# Patient Record
Sex: Female | Born: 1955 | Race: White | Hispanic: No | Marital: Single | Smoking: Former smoker
Health system: Southern US, Community
[De-identification: ages and names within clinical notes are randomized; demographics above are authoritative.]

## PROBLEM LIST (undated history)

## (undated) DIAGNOSIS — I10 Essential (primary) hypertension: Secondary | ICD-10-CM

## (undated) DIAGNOSIS — R0600 Dyspnea, unspecified: Secondary | ICD-10-CM

## (undated) DIAGNOSIS — J189 Pneumonia, unspecified organism: Secondary | ICD-10-CM

## (undated) DIAGNOSIS — J439 Emphysema, unspecified: Secondary | ICD-10-CM

## (undated) DIAGNOSIS — I739 Peripheral vascular disease, unspecified: Secondary | ICD-10-CM

## (undated) DIAGNOSIS — F419 Anxiety disorder, unspecified: Secondary | ICD-10-CM

## (undated) DIAGNOSIS — F32A Depression, unspecified: Secondary | ICD-10-CM

## (undated) HISTORY — PX: TONSILLECTOMY: SUR1361

## (undated) HISTORY — PX: ABDOMINAL HYSTERECTOMY: SHX81

## (undated) HISTORY — DX: Emphysema, unspecified: J43.9

## (undated) HISTORY — DX: Depression, unspecified: F32.A

---

## 2004-01-09 ENCOUNTER — Emergency Department (HOSPITAL_COMMUNITY): Admission: EM | Admit: 2004-01-09 | Discharge: 2004-01-09 | Payer: Self-pay | Admitting: Emergency Medicine

## 2005-11-04 ENCOUNTER — Emergency Department (HOSPITAL_COMMUNITY): Admission: EM | Admit: 2005-11-04 | Discharge: 2005-11-04 | Payer: Self-pay | Admitting: Emergency Medicine

## 2006-04-15 ENCOUNTER — Emergency Department (HOSPITAL_COMMUNITY): Admission: EM | Admit: 2006-04-15 | Discharge: 2006-04-15 | Payer: Self-pay | Admitting: Emergency Medicine

## 2006-05-20 ENCOUNTER — Emergency Department (HOSPITAL_COMMUNITY): Admission: EM | Admit: 2006-05-20 | Discharge: 2006-05-20 | Payer: Self-pay | Admitting: Emergency Medicine

## 2006-09-08 ENCOUNTER — Emergency Department (HOSPITAL_COMMUNITY): Admission: EM | Admit: 2006-09-08 | Discharge: 2006-09-08 | Payer: Self-pay | Admitting: Emergency Medicine

## 2007-05-01 ENCOUNTER — Emergency Department (HOSPITAL_COMMUNITY): Admission: EM | Admit: 2007-05-01 | Discharge: 2007-05-01 | Payer: Self-pay | Admitting: Emergency Medicine

## 2007-06-13 ENCOUNTER — Emergency Department (HOSPITAL_COMMUNITY): Admission: EM | Admit: 2007-06-13 | Discharge: 2007-06-13 | Payer: Self-pay | Admitting: Emergency Medicine

## 2007-07-18 ENCOUNTER — Emergency Department (HOSPITAL_COMMUNITY): Admission: EM | Admit: 2007-07-18 | Discharge: 2007-07-18 | Payer: Self-pay | Admitting: Emergency Medicine

## 2007-07-30 ENCOUNTER — Emergency Department (HOSPITAL_COMMUNITY): Admission: EM | Admit: 2007-07-30 | Discharge: 2007-07-30 | Payer: Self-pay | Admitting: Emergency Medicine

## 2007-08-05 ENCOUNTER — Emergency Department (HOSPITAL_COMMUNITY): Admission: EM | Admit: 2007-08-05 | Discharge: 2007-08-05 | Payer: Self-pay | Admitting: Emergency Medicine

## 2007-09-27 ENCOUNTER — Emergency Department (HOSPITAL_COMMUNITY): Admission: EM | Admit: 2007-09-27 | Discharge: 2007-09-27 | Payer: Self-pay | Admitting: Psychology

## 2008-01-15 ENCOUNTER — Emergency Department (HOSPITAL_COMMUNITY): Admission: EM | Admit: 2008-01-15 | Discharge: 2008-01-15 | Payer: Self-pay | Admitting: Emergency Medicine

## 2008-11-16 ENCOUNTER — Emergency Department (HOSPITAL_COMMUNITY): Admission: EM | Admit: 2008-11-16 | Discharge: 2008-11-16 | Payer: Self-pay | Admitting: Emergency Medicine

## 2009-02-06 ENCOUNTER — Emergency Department (HOSPITAL_COMMUNITY): Admission: EM | Admit: 2009-02-06 | Discharge: 2009-02-06 | Payer: Self-pay | Admitting: Emergency Medicine

## 2009-05-15 ENCOUNTER — Emergency Department (HOSPITAL_COMMUNITY): Admission: EM | Admit: 2009-05-15 | Discharge: 2009-05-15 | Payer: Self-pay | Admitting: Emergency Medicine

## 2009-11-01 ENCOUNTER — Emergency Department (HOSPITAL_COMMUNITY): Admission: EM | Admit: 2009-11-01 | Discharge: 2009-11-01 | Payer: Self-pay | Admitting: Emergency Medicine

## 2010-02-05 ENCOUNTER — Emergency Department (HOSPITAL_COMMUNITY): Admission: EM | Admit: 2010-02-05 | Discharge: 2010-02-05 | Payer: Self-pay | Admitting: Emergency Medicine

## 2010-11-23 LAB — GLUCOSE, CAPILLARY: Glucose-Capillary: 96 mg/dL (ref 70–99)

## 2010-12-14 LAB — URINALYSIS, ROUTINE W REFLEX MICROSCOPIC
Glucose, UA: NEGATIVE mg/dL
Ketones, ur: NEGATIVE mg/dL
Nitrite: NEGATIVE
pH: 5.5 (ref 5.0–8.0)

## 2010-12-14 LAB — DIFFERENTIAL
Basophils Absolute: 0.1 10*3/uL (ref 0.0–0.1)
Eosinophils Absolute: 0.2 10*3/uL (ref 0.0–0.7)
Eosinophils Relative: 3 % (ref 0–5)
Lymphocytes Relative: 32 % (ref 12–46)
Lymphs Abs: 1.5 10*3/uL (ref 0.7–4.0)
Monocytes Absolute: 0.4 10*3/uL (ref 0.1–1.0)
Monocytes Relative: 8 % (ref 3–12)

## 2010-12-14 LAB — COMPREHENSIVE METABOLIC PANEL
ALT: <8 U/L (ref 0–35)
AST: 19 U/L (ref 0–37)
Albumin: 3.8 g/dL (ref 3.5–5.2)
Alkaline Phosphatase: 95 U/L (ref 39–117)
Calcium: 9.7 mg/dL (ref 8.4–10.5)
Chloride: 96 mEq/L (ref 96–112)
Creatinine, Ser: 1.27 mg/dL — ABNORMAL HIGH (ref 0.4–1.2)
Total Bilirubin: 0.5 mg/dL (ref 0.3–1.2)
Total Protein: 7.2 g/dL (ref 6.0–8.3)

## 2010-12-14 LAB — LIPASE, BLOOD: Lipase: 30 U/L (ref 11–59)

## 2010-12-14 LAB — URINE MICROSCOPIC-ADD ON

## 2010-12-14 LAB — CBC
Platelets: 202 10*3/uL (ref 150–400)
RBC: 4.23 MIL/uL (ref 3.87–5.11)

## 2011-09-22 DIAGNOSIS — F332 Major depressive disorder, recurrent severe without psychotic features: Secondary | ICD-10-CM | POA: Diagnosis not present

## 2011-10-01 IMAGING — CT CT HEAD W/O CM
4 of 5 series · 13 of 47 positions shown, 14 images · non-contrast
Comparison: None.

CT HEAD

CLINICAL DATA: Fall with a blow the head.

CT HEAD WITHOUT CONTRAST
CT CERVICAL SPINE WITHOUT CONTRAST
TECHNIQUE: Multidetector CT imaging of the head and cervical spine
was performed following the standard protocol without intravenous
contrast.  Multiplanar CT image reconstructions of the cervical
spine were also generated.

[Series 2: headseq 4.8 h37s · axial · 0.42mm/px · z∈[+1027,+1077]mm · 2 of 30 slices shown, 3 images]
[im 10/30  brain]
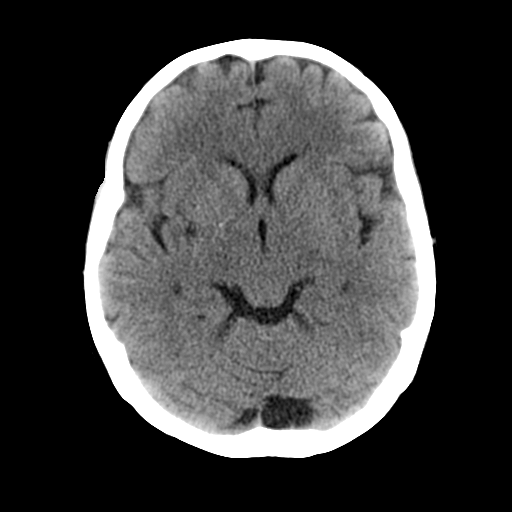
[im 10/30  bone]
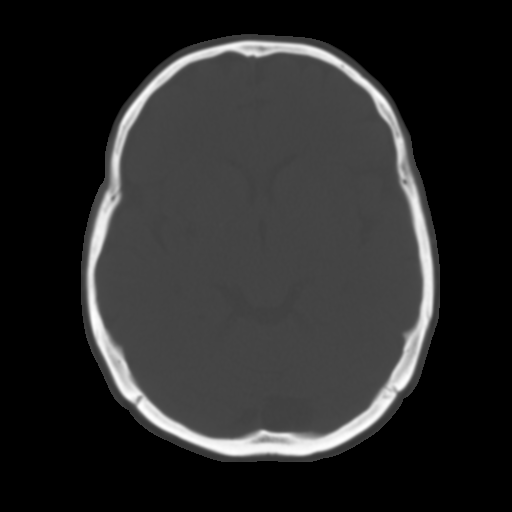
[im 20/30  brain]
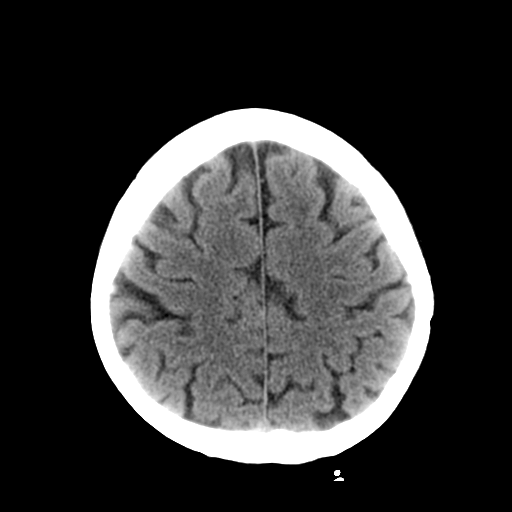

[Series 4: cervical st 2.0 b31s · axial · 0.25mm/px · z∈[+810,+896]mm · 5 of 95 slices shown]
[im 8/95  brain]
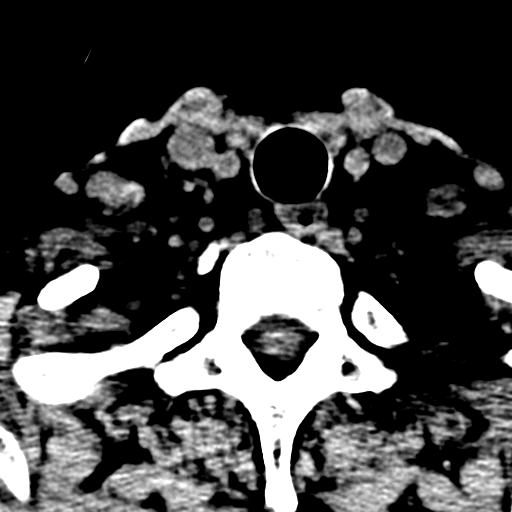
[im 22/95  brain]
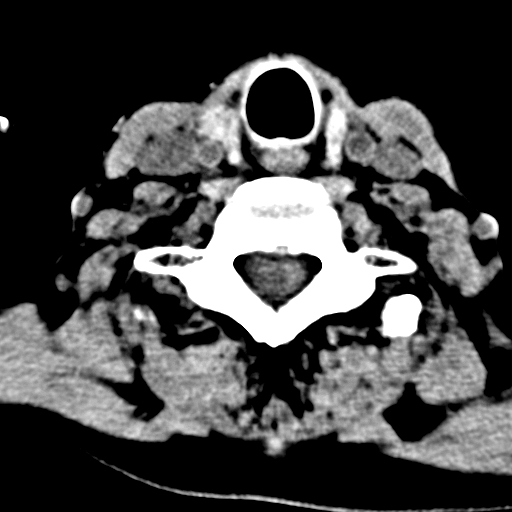
[im 29/95  brain]
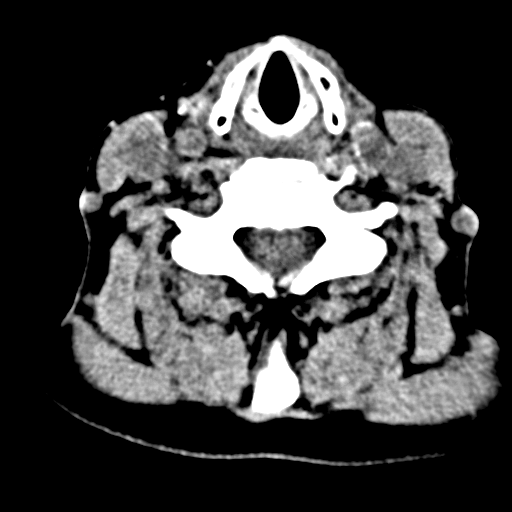
[im 44/95  brain]
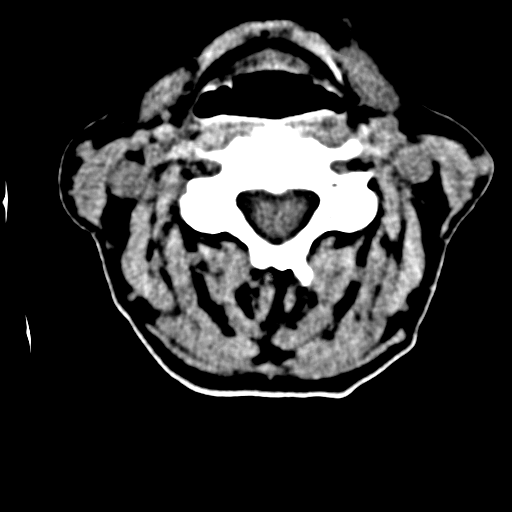
[im 51/95  brain]
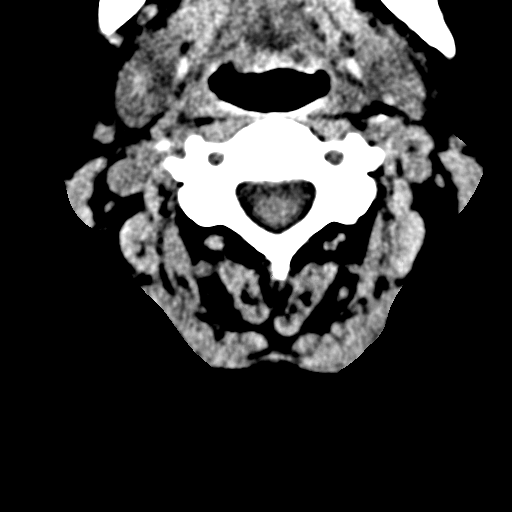

[Series 7: cervical coro (id) · coronal · 0.14mm/px · 3 of 41 slices shown]
[im 14/41  brain]
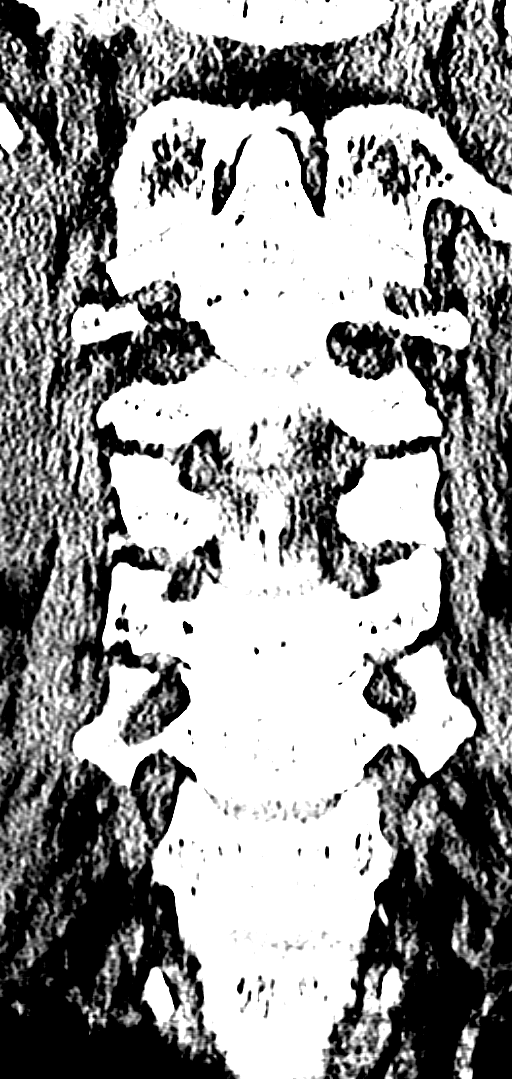
[im 18/41  brain]
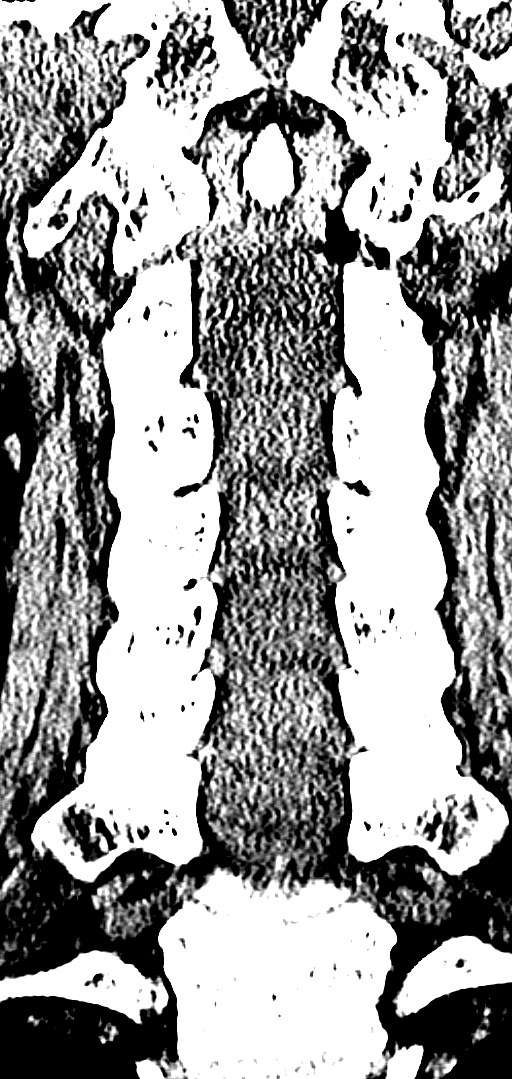
[im 23/41  brain]
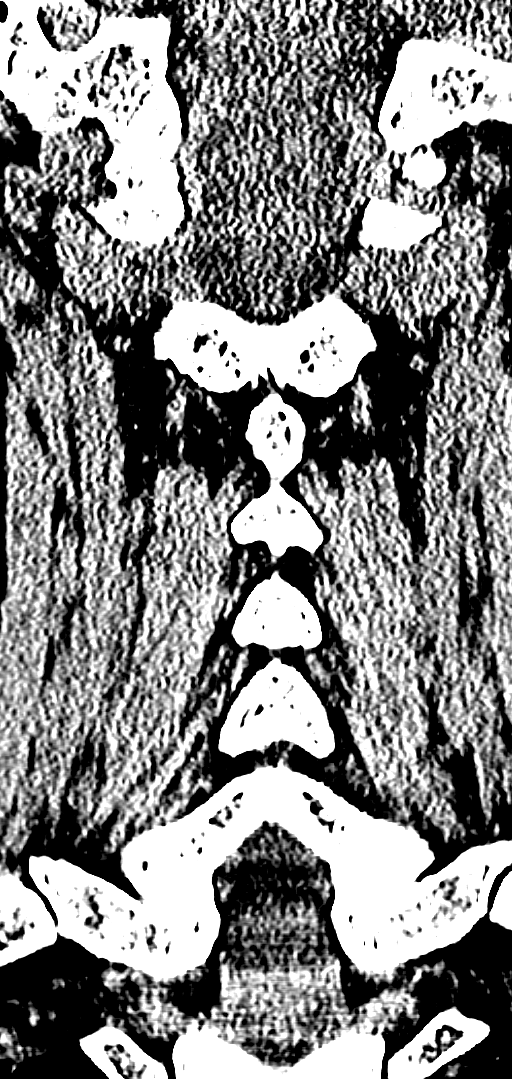

[Series 8: cervical sag (id) · sagittal · 0.18mm/px · 3 of 40 slices shown]
[im 14/40  brain]
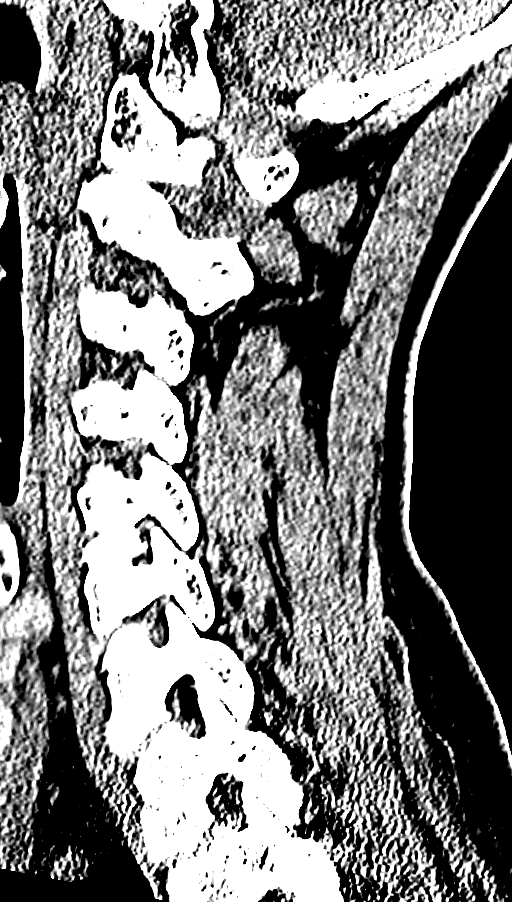
[im 20/40  brain]
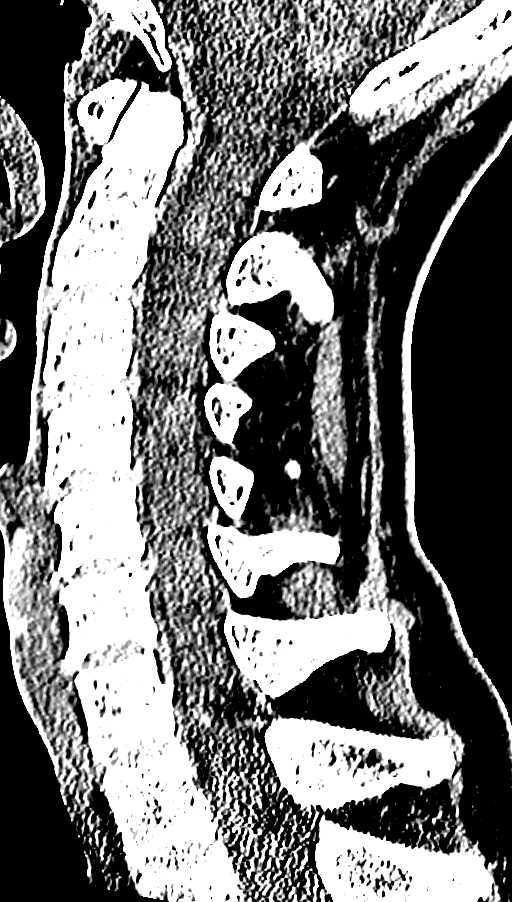
[im 27/40  brain]
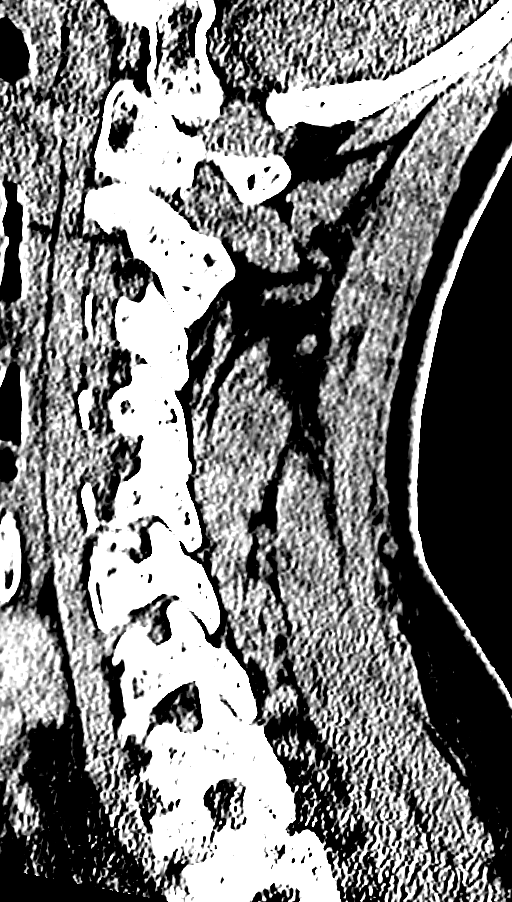

[13 of 47 positions shown; findings below may reference images not displayed]

FINDINGS: There is cortical atrophy.  Remote lacunar infarction in
the right centrum semiovale is noted.  No evidence of acute
infarction, hemorrhage, mass lesion, mass effect, midline shift or
abnormal extra-axial fluid collection.  No hydrocephalus.
Calvarium intact.  Imaged paranasal sinuses and mastoid air cells
clear.
IMPRESSION: 1.  No acute abnormality.
2.  Mild atrophy.
3.  Remote lacunar infarction right centrum semiovale.

CT CERVICAL SPINE
FINDINGS: There is no fracture or subluxation of the cervical
spine.  No epidural hematoma.  Loss of disc space height and
endplate spurring C5-6 is noted.  Note is made of small locules of
air scattered in soft tissues of the neck bilaterally which may be
related to IV access.  The appearance is not typical of
pneumomediastinum.  Lung apices are clear.
IMPRESSION: 1.  No fracture or subluxation.
2.  C5-6 degenerative disc disease.
3.  Small locules of scattered soft tissue air may be related to
venous access.

## 2011-12-29 DIAGNOSIS — F332 Major depressive disorder, recurrent severe without psychotic features: Secondary | ICD-10-CM | POA: Diagnosis not present

## 2012-03-29 DIAGNOSIS — F332 Major depressive disorder, recurrent severe without psychotic features: Secondary | ICD-10-CM | POA: Diagnosis not present

## 2012-06-21 DIAGNOSIS — F332 Major depressive disorder, recurrent severe without psychotic features: Secondary | ICD-10-CM | POA: Diagnosis not present

## 2012-08-17 ENCOUNTER — Emergency Department (HOSPITAL_COMMUNITY)
Admission: EM | Admit: 2012-08-17 | Discharge: 2012-08-18 | Disposition: A | Discharge status: Home / Self Care | Payer: Medicare Other | Attending: Emergency Medicine | Admitting: Emergency Medicine

## 2012-08-17 ENCOUNTER — Encounter (HOSPITAL_COMMUNITY): Payer: Self-pay | Admitting: *Deleted

## 2012-08-17 DIAGNOSIS — M542 Cervicalgia: Secondary | ICD-10-CM | POA: Diagnosis not present

## 2012-08-17 DIAGNOSIS — R51 Headache: Secondary | ICD-10-CM | POA: Diagnosis not present

## 2012-08-17 DIAGNOSIS — I10 Essential (primary) hypertension: Secondary | ICD-10-CM | POA: Diagnosis not present

## 2012-08-17 DIAGNOSIS — Z7982 Long term (current) use of aspirin: Secondary | ICD-10-CM | POA: Insufficient documentation

## 2012-08-17 DIAGNOSIS — I119 Hypertensive heart disease without heart failure: Secondary | ICD-10-CM | POA: Diagnosis not present

## 2012-08-17 DIAGNOSIS — F17200 Nicotine dependence, unspecified, uncomplicated: Secondary | ICD-10-CM | POA: Diagnosis not present

## 2012-08-17 DIAGNOSIS — F411 Generalized anxiety disorder: Secondary | ICD-10-CM | POA: Diagnosis not present

## 2012-08-17 DIAGNOSIS — Z79899 Other long term (current) drug therapy: Secondary | ICD-10-CM | POA: Insufficient documentation

## 2012-08-17 HISTORY — DX: Anxiety disorder, unspecified: F41.9

## 2012-08-17 HISTORY — DX: Essential (primary) hypertension: I10

## 2012-08-17 MED ORDER — LISINOPRIL 20 MG PO TABS: 20.0000 mg | ORAL_TABLET | Freq: Every day | ORAL | Status: DC

## 2012-08-17 MED ORDER — SODIUM CHLORIDE 0.9 % IV SOLN
1000.0000 mL | Freq: Once | INTRAVENOUS | Status: AC
  Administered 2012-08-17: 1000 mL via INTRAVENOUS

## 2012-08-17 MED ORDER — SODIUM CHLORIDE 0.9 % IV SOLN: 1000.0000 mL | INTRAVENOUS | Status: DC

## 2012-08-17 MED ORDER — METOCLOPRAMIDE HCL 5 MG/ML IJ SOLN
10.0000 mg | Freq: Once | INTRAMUSCULAR | Status: AC
  Administered 2012-08-17: 10 mg via INTRAVENOUS
  Filled 2012-08-17: qty 2

## 2012-08-17 MED ORDER — DIPHENHYDRAMINE HCL 50 MG/ML IJ SOLN
25.0000 mg | Freq: Once | INTRAMUSCULAR | Status: AC
  Administered 2012-08-17: 25 mg via INTRAVENOUS
  Filled 2012-08-17: qty 1

## 2012-08-17 MED ORDER — CLONIDINE HCL 0.1 MG PO TABS
0.1000 mg | ORAL_TABLET | Freq: Once | ORAL | Status: AC
  Administered 2012-08-17: 0.1 mg via ORAL
  Filled 2012-08-17: qty 1
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
## 2012-08-17 NOTE — ED Notes (Signed)
Reported by ems called out for hypertension & headache. Pt has been off her medication for a while. EMS gave two nitro sprays & pressure after that was 180/104

## 2012-08-17 NOTE — ED Notes (Signed)
EDP advised of pt heart rate dropping to 38 bpm.

## 2012-08-18 LAB — POCT I-STAT, CHEM 8
Creatinine, Ser: 0.8 mg/dL (ref 0.50–1.10)
HCT: 35 % — ABNORMAL LOW (ref 36.0–46.0)
Hemoglobin: 11.9 g/dL — ABNORMAL LOW (ref 12.0–15.0)
Potassium: 3.1 mEq/L — ABNORMAL LOW (ref 3.5–5.1)
Sodium: 145 mEq/L (ref 135–145)
TCO2: 22 mmol/L (ref 0–100)

## 2012-08-18 NOTE — ED Notes (Signed)
Pt alert & oriented x4, stable gait. Patient given discharge instructions, paperwork & prescription(s). Patient  instructed to stop at the registration desk to finish any additional paperwork. Patient verbalized understanding. Pt left department w/ no further questions. 

## 2012-09-18 DIAGNOSIS — F332 Major depressive disorder, recurrent severe without psychotic features: Secondary | ICD-10-CM | POA: Diagnosis not present

## 2012-12-11 DIAGNOSIS — Z79899 Other long term (current) drug therapy: Secondary | ICD-10-CM | POA: Diagnosis not present

## 2012-12-11 DIAGNOSIS — F411 Generalized anxiety disorder: Secondary | ICD-10-CM | POA: Diagnosis not present

## 2012-12-11 DIAGNOSIS — F339 Major depressive disorder, recurrent, unspecified: Secondary | ICD-10-CM | POA: Diagnosis not present

## 2014-08-21 DIAGNOSIS — R109 Unspecified abdominal pain: Secondary | ICD-10-CM | POA: Diagnosis not present

## 2014-08-21 DIAGNOSIS — R6884 Jaw pain: Secondary | ICD-10-CM | POA: Diagnosis not present

## 2014-08-21 DIAGNOSIS — R05 Cough: Secondary | ICD-10-CM | POA: Diagnosis not present

## 2014-08-21 DIAGNOSIS — J449 Chronic obstructive pulmonary disease, unspecified: Secondary | ICD-10-CM | POA: Diagnosis not present

## 2014-08-21 DIAGNOSIS — F419 Anxiety disorder, unspecified: Secondary | ICD-10-CM | POA: Diagnosis not present

## 2014-08-21 DIAGNOSIS — R918 Other nonspecific abnormal finding of lung field: Secondary | ICD-10-CM | POA: Diagnosis not present

## 2014-08-21 DIAGNOSIS — M2480 Other specific joint derangements of unspecified joint, not elsewhere classified: Secondary | ICD-10-CM | POA: Diagnosis not present

## 2014-08-21 DIAGNOSIS — Z8673 Personal history of transient ischemic attack (TIA), and cerebral infarction without residual deficits: Secondary | ICD-10-CM | POA: Diagnosis not present

## 2014-08-21 DIAGNOSIS — R51 Headache: Secondary | ICD-10-CM | POA: Diagnosis not present

## 2014-08-21 DIAGNOSIS — Z90710 Acquired absence of both cervix and uterus: Secondary | ICD-10-CM | POA: Diagnosis not present

## 2014-08-21 DIAGNOSIS — R9431 Abnormal electrocardiogram [ECG] [EKG]: Secondary | ICD-10-CM | POA: Diagnosis not present

## 2014-08-21 DIAGNOSIS — Z8709 Personal history of other diseases of the respiratory system: Secondary | ICD-10-CM | POA: Diagnosis not present

## 2014-08-21 DIAGNOSIS — F329 Major depressive disorder, single episode, unspecified: Secondary | ICD-10-CM | POA: Diagnosis not present

## 2014-08-21 DIAGNOSIS — R030 Elevated blood-pressure reading, without diagnosis of hypertension: Secondary | ICD-10-CM | POA: Diagnosis not present

## 2014-08-21 DIAGNOSIS — J189 Pneumonia, unspecified organism: Secondary | ICD-10-CM | POA: Diagnosis not present

## 2014-08-21 DIAGNOSIS — R112 Nausea with vomiting, unspecified: Secondary | ICD-10-CM | POA: Diagnosis not present

## 2014-08-21 DIAGNOSIS — R0989 Other specified symptoms and signs involving the circulatory and respiratory systems: Secondary | ICD-10-CM | POA: Diagnosis not present

## 2014-08-21 DIAGNOSIS — F17210 Nicotine dependence, cigarettes, uncomplicated: Secondary | ICD-10-CM | POA: Diagnosis not present

## 2014-08-21 DIAGNOSIS — Z9889 Other specified postprocedural states: Secondary | ICD-10-CM | POA: Diagnosis not present

## 2014-08-21 DIAGNOSIS — J441 Chronic obstructive pulmonary disease with (acute) exacerbation: Secondary | ICD-10-CM | POA: Diagnosis not present

## 2014-08-21 DIAGNOSIS — R630 Anorexia: Secondary | ICD-10-CM | POA: Diagnosis not present

## 2015-01-08 DIAGNOSIS — Z8673 Personal history of transient ischemic attack (TIA), and cerebral infarction without residual deficits: Secondary | ICD-10-CM | POA: Diagnosis not present

## 2015-01-08 DIAGNOSIS — F17200 Nicotine dependence, unspecified, uncomplicated: Secondary | ICD-10-CM | POA: Diagnosis not present

## 2015-01-08 DIAGNOSIS — M542 Cervicalgia: Secondary | ICD-10-CM | POA: Diagnosis not present

## 2015-01-08 DIAGNOSIS — M25512 Pain in left shoulder: Secondary | ICD-10-CM | POA: Diagnosis not present

## 2015-01-08 DIAGNOSIS — M79622 Pain in left upper arm: Secondary | ICD-10-CM | POA: Diagnosis not present

## 2015-01-08 DIAGNOSIS — I10 Essential (primary) hypertension: Secondary | ICD-10-CM | POA: Diagnosis not present

## 2015-01-09 DIAGNOSIS — I10 Essential (primary) hypertension: Secondary | ICD-10-CM | POA: Diagnosis not present

## 2015-01-09 DIAGNOSIS — M542 Cervicalgia: Secondary | ICD-10-CM | POA: Diagnosis not present

## 2015-01-09 DIAGNOSIS — F411 Generalized anxiety disorder: Secondary | ICD-10-CM | POA: Diagnosis not present

## 2015-01-09 DIAGNOSIS — J440 Chronic obstructive pulmonary disease with acute lower respiratory infection: Secondary | ICD-10-CM | POA: Diagnosis not present

## 2015-01-09 DIAGNOSIS — R5383 Other fatigue: Secondary | ICD-10-CM | POA: Diagnosis not present

## 2015-01-15 DIAGNOSIS — F411 Generalized anxiety disorder: Secondary | ICD-10-CM | POA: Diagnosis not present

## 2015-01-20 DIAGNOSIS — Z1231 Encounter for screening mammogram for malignant neoplasm of breast: Secondary | ICD-10-CM | POA: Diagnosis not present

## 2015-01-30 DIAGNOSIS — F17200 Nicotine dependence, unspecified, uncomplicated: Secondary | ICD-10-CM | POA: Diagnosis not present

## 2015-01-30 DIAGNOSIS — Z780 Asymptomatic menopausal state: Secondary | ICD-10-CM | POA: Diagnosis not present

## 2015-01-30 DIAGNOSIS — M810 Age-related osteoporosis without current pathological fracture: Secondary | ICD-10-CM | POA: Diagnosis not present

## 2015-01-30 DIAGNOSIS — Z79899 Other long term (current) drug therapy: Secondary | ICD-10-CM | POA: Diagnosis not present

## 2015-01-30 DIAGNOSIS — J449 Chronic obstructive pulmonary disease, unspecified: Secondary | ICD-10-CM | POA: Diagnosis not present

## 2015-01-30 DIAGNOSIS — Z7951 Long term (current) use of inhaled steroids: Secondary | ICD-10-CM | POA: Diagnosis not present

## 2015-01-30 DIAGNOSIS — M8588 Other specified disorders of bone density and structure, other site: Secondary | ICD-10-CM | POA: Diagnosis not present

## 2015-01-30 DIAGNOSIS — I10 Essential (primary) hypertension: Secondary | ICD-10-CM | POA: Diagnosis not present

## 2015-01-30 DIAGNOSIS — M549 Dorsalgia, unspecified: Secondary | ICD-10-CM | POA: Diagnosis not present

## 2015-02-13 DIAGNOSIS — F411 Generalized anxiety disorder: Secondary | ICD-10-CM | POA: Diagnosis not present

## 2015-02-21 DIAGNOSIS — F17210 Nicotine dependence, cigarettes, uncomplicated: Secondary | ICD-10-CM | POA: Diagnosis not present

## 2015-02-21 DIAGNOSIS — Z79899 Other long term (current) drug therapy: Secondary | ICD-10-CM | POA: Diagnosis not present

## 2015-02-21 DIAGNOSIS — F419 Anxiety disorder, unspecified: Secondary | ICD-10-CM | POA: Diagnosis not present

## 2015-02-21 DIAGNOSIS — J449 Chronic obstructive pulmonary disease, unspecified: Secondary | ICD-10-CM | POA: Diagnosis not present

## 2015-02-21 DIAGNOSIS — G4700 Insomnia, unspecified: Secondary | ICD-10-CM | POA: Diagnosis not present

## 2015-02-21 DIAGNOSIS — F329 Major depressive disorder, single episode, unspecified: Secondary | ICD-10-CM | POA: Diagnosis not present

## 2015-02-21 DIAGNOSIS — Z1211 Encounter for screening for malignant neoplasm of colon: Secondary | ICD-10-CM | POA: Diagnosis not present

## 2015-03-04 DIAGNOSIS — Z0000 Encounter for general adult medical examination without abnormal findings: Secondary | ICD-10-CM | POA: Diagnosis not present

## 2015-03-04 DIAGNOSIS — J440 Chronic obstructive pulmonary disease with acute lower respiratory infection: Secondary | ICD-10-CM | POA: Diagnosis not present

## 2015-03-04 DIAGNOSIS — M810 Age-related osteoporosis without current pathological fracture: Secondary | ICD-10-CM | POA: Diagnosis not present

## 2015-03-04 DIAGNOSIS — I10 Essential (primary) hypertension: Secondary | ICD-10-CM | POA: Diagnosis not present

## 2015-03-11 DIAGNOSIS — F411 Generalized anxiety disorder: Secondary | ICD-10-CM | POA: Diagnosis not present

## 2015-04-21 DIAGNOSIS — M545 Low back pain: Secondary | ICD-10-CM | POA: Diagnosis not present

## 2015-04-21 DIAGNOSIS — G603 Idiopathic progressive neuropathy: Secondary | ICD-10-CM | POA: Diagnosis not present

## 2015-04-21 DIAGNOSIS — M810 Age-related osteoporosis without current pathological fracture: Secondary | ICD-10-CM | POA: Diagnosis not present

## 2015-04-21 DIAGNOSIS — M542 Cervicalgia: Secondary | ICD-10-CM | POA: Diagnosis not present

## 2015-04-21 DIAGNOSIS — E46 Unspecified protein-calorie malnutrition: Secondary | ICD-10-CM | POA: Diagnosis not present

## 2015-04-23 DIAGNOSIS — F411 Generalized anxiety disorder: Secondary | ICD-10-CM | POA: Diagnosis not present

## 2015-05-05 DIAGNOSIS — M545 Low back pain: Secondary | ICD-10-CM | POA: Diagnosis not present

## 2015-05-05 DIAGNOSIS — M546 Pain in thoracic spine: Secondary | ICD-10-CM | POA: Diagnosis not present

## 2015-05-05 DIAGNOSIS — Z79891 Long term (current) use of opiate analgesic: Secondary | ICD-10-CM | POA: Diagnosis not present

## 2015-05-05 DIAGNOSIS — G8929 Other chronic pain: Secondary | ICD-10-CM | POA: Diagnosis not present

## 2015-05-29 DIAGNOSIS — J180 Bronchopneumonia, unspecified organism: Secondary | ICD-10-CM | POA: Diagnosis not present

## 2015-06-20 DIAGNOSIS — M50323 Other cervical disc degeneration at C6-C7 level: Secondary | ICD-10-CM | POA: Diagnosis not present

## 2015-06-20 DIAGNOSIS — M5021 Other cervical disc displacement,  high cervical region: Secondary | ICD-10-CM | POA: Diagnosis not present

## 2015-06-20 DIAGNOSIS — M50322 Other cervical disc degeneration at C5-C6 level: Secondary | ICD-10-CM | POA: Diagnosis not present

## 2015-06-20 DIAGNOSIS — M4802 Spinal stenosis, cervical region: Secondary | ICD-10-CM | POA: Diagnosis not present

## 2015-06-20 DIAGNOSIS — S161XXA Strain of muscle, fascia and tendon at neck level, initial encounter: Secondary | ICD-10-CM | POA: Diagnosis not present

## 2015-06-20 DIAGNOSIS — J0100 Acute maxillary sinusitis, unspecified: Secondary | ICD-10-CM | POA: Diagnosis not present

## 2015-06-20 DIAGNOSIS — S39012A Strain of muscle, fascia and tendon of lower back, initial encounter: Secondary | ICD-10-CM | POA: Diagnosis not present

## 2015-06-20 DIAGNOSIS — M4806 Spinal stenosis, lumbar region: Secondary | ICD-10-CM | POA: Diagnosis not present

## 2015-06-20 DIAGNOSIS — M5126 Other intervertebral disc displacement, lumbar region: Secondary | ICD-10-CM | POA: Diagnosis not present

## 2015-07-15 DIAGNOSIS — F411 Generalized anxiety disorder: Secondary | ICD-10-CM | POA: Diagnosis not present

## 2015-08-28 DIAGNOSIS — M545 Low back pain: Secondary | ICD-10-CM | POA: Diagnosis not present

## 2015-08-28 DIAGNOSIS — I10 Essential (primary) hypertension: Secondary | ICD-10-CM | POA: Diagnosis not present

## 2015-09-27 DIAGNOSIS — F329 Major depressive disorder, single episode, unspecified: Secondary | ICD-10-CM | POA: Diagnosis not present

## 2015-09-27 DIAGNOSIS — Z8673 Personal history of transient ischemic attack (TIA), and cerebral infarction without residual deficits: Secondary | ICD-10-CM | POA: Diagnosis not present

## 2015-09-27 DIAGNOSIS — Z79899 Other long term (current) drug therapy: Secondary | ICD-10-CM | POA: Diagnosis not present

## 2015-09-27 DIAGNOSIS — I10 Essential (primary) hypertension: Secondary | ICD-10-CM | POA: Diagnosis not present

## 2015-09-27 DIAGNOSIS — Z720 Tobacco use: Secondary | ICD-10-CM | POA: Diagnosis not present

## 2015-09-27 DIAGNOSIS — R0602 Shortness of breath: Secondary | ICD-10-CM | POA: Diagnosis not present

## 2015-09-27 DIAGNOSIS — F17200 Nicotine dependence, unspecified, uncomplicated: Secondary | ICD-10-CM | POA: Diagnosis not present

## 2015-09-27 DIAGNOSIS — J441 Chronic obstructive pulmonary disease with (acute) exacerbation: Secondary | ICD-10-CM | POA: Diagnosis not present

## 2015-10-15 DIAGNOSIS — F411 Generalized anxiety disorder: Secondary | ICD-10-CM | POA: Diagnosis not present

## 2015-10-24 DIAGNOSIS — E559 Vitamin D deficiency, unspecified: Secondary | ICD-10-CM | POA: Diagnosis not present

## 2015-10-24 DIAGNOSIS — J440 Chronic obstructive pulmonary disease with acute lower respiratory infection: Secondary | ICD-10-CM | POA: Diagnosis not present

## 2015-10-24 DIAGNOSIS — M545 Low back pain: Secondary | ICD-10-CM | POA: Diagnosis not present

## 2015-10-24 DIAGNOSIS — I10 Essential (primary) hypertension: Secondary | ICD-10-CM | POA: Diagnosis not present

## 2015-12-09 DIAGNOSIS — F411 Generalized anxiety disorder: Secondary | ICD-10-CM | POA: Diagnosis not present

## 2015-12-15 DIAGNOSIS — J440 Chronic obstructive pulmonary disease with acute lower respiratory infection: Secondary | ICD-10-CM | POA: Diagnosis not present

## 2016-01-26 DIAGNOSIS — E784 Other hyperlipidemia: Secondary | ICD-10-CM | POA: Diagnosis not present

## 2016-01-26 DIAGNOSIS — F411 Generalized anxiety disorder: Secondary | ICD-10-CM | POA: Diagnosis not present

## 2016-01-26 DIAGNOSIS — J3089 Other allergic rhinitis: Secondary | ICD-10-CM | POA: Diagnosis not present

## 2016-01-26 DIAGNOSIS — M545 Low back pain: Secondary | ICD-10-CM | POA: Diagnosis not present

## 2016-01-26 DIAGNOSIS — J440 Chronic obstructive pulmonary disease with acute lower respiratory infection: Secondary | ICD-10-CM | POA: Diagnosis not present

## 2016-01-26 DIAGNOSIS — M810 Age-related osteoporosis without current pathological fracture: Secondary | ICD-10-CM | POA: Diagnosis not present

## 2016-02-17 DIAGNOSIS — F339 Major depressive disorder, recurrent, unspecified: Secondary | ICD-10-CM | POA: Diagnosis not present

## 2016-03-18 DIAGNOSIS — M810 Age-related osteoporosis without current pathological fracture: Secondary | ICD-10-CM | POA: Diagnosis not present

## 2016-03-18 DIAGNOSIS — F411 Generalized anxiety disorder: Secondary | ICD-10-CM | POA: Diagnosis not present

## 2016-03-18 DIAGNOSIS — I10 Essential (primary) hypertension: Secondary | ICD-10-CM | POA: Diagnosis not present

## 2016-03-18 DIAGNOSIS — J441 Chronic obstructive pulmonary disease with (acute) exacerbation: Secondary | ICD-10-CM | POA: Diagnosis not present

## 2016-03-18 DIAGNOSIS — M545 Low back pain: Secondary | ICD-10-CM | POA: Diagnosis not present

## 2016-03-18 DIAGNOSIS — E784 Other hyperlipidemia: Secondary | ICD-10-CM | POA: Diagnosis not present

## 2016-03-23 DIAGNOSIS — F339 Major depressive disorder, recurrent, unspecified: Secondary | ICD-10-CM | POA: Diagnosis not present

## 2016-04-20 DIAGNOSIS — F411 Generalized anxiety disorder: Secondary | ICD-10-CM | POA: Diagnosis not present

## 2016-05-12 DIAGNOSIS — M545 Low back pain: Secondary | ICD-10-CM | POA: Diagnosis not present

## 2016-05-12 DIAGNOSIS — F411 Generalized anxiety disorder: Secondary | ICD-10-CM | POA: Diagnosis not present

## 2016-05-12 DIAGNOSIS — J440 Chronic obstructive pulmonary disease with acute lower respiratory infection: Secondary | ICD-10-CM | POA: Diagnosis not present

## 2016-05-12 DIAGNOSIS — M810 Age-related osteoporosis without current pathological fracture: Secondary | ICD-10-CM | POA: Diagnosis not present

## 2016-05-12 DIAGNOSIS — E784 Other hyperlipidemia: Secondary | ICD-10-CM | POA: Diagnosis not present

## 2016-05-25 DIAGNOSIS — F411 Generalized anxiety disorder: Secondary | ICD-10-CM | POA: Diagnosis not present

## 2016-07-13 DIAGNOSIS — F411 Generalized anxiety disorder: Secondary | ICD-10-CM | POA: Diagnosis not present

## 2016-08-16 DIAGNOSIS — Z131 Encounter for screening for diabetes mellitus: Secondary | ICD-10-CM | POA: Diagnosis not present

## 2016-08-16 DIAGNOSIS — R5383 Other fatigue: Secondary | ICD-10-CM | POA: Diagnosis not present

## 2016-08-16 DIAGNOSIS — Z0000 Encounter for general adult medical examination without abnormal findings: Secondary | ICD-10-CM | POA: Diagnosis not present

## 2016-08-16 DIAGNOSIS — J440 Chronic obstructive pulmonary disease with acute lower respiratory infection: Secondary | ICD-10-CM | POA: Diagnosis not present

## 2016-08-16 DIAGNOSIS — E784 Other hyperlipidemia: Secondary | ICD-10-CM | POA: Diagnosis not present

## 2016-08-16 DIAGNOSIS — M810 Age-related osteoporosis without current pathological fracture: Secondary | ICD-10-CM | POA: Diagnosis not present

## 2016-08-16 DIAGNOSIS — Z1389 Encounter for screening for other disorder: Secondary | ICD-10-CM | POA: Diagnosis not present

## 2016-08-16 DIAGNOSIS — F411 Generalized anxiety disorder: Secondary | ICD-10-CM | POA: Diagnosis not present

## 2016-08-16 DIAGNOSIS — M545 Low back pain: Secondary | ICD-10-CM | POA: Diagnosis not present

## 2016-09-06 HISTORY — PX: AMPUTATION FINGER: SHX6594

## 2016-09-30 DIAGNOSIS — J168 Pneumonia due to other specified infectious organisms: Secondary | ICD-10-CM | POA: Diagnosis not present

## 2016-10-07 DIAGNOSIS — F411 Generalized anxiety disorder: Secondary | ICD-10-CM | POA: Diagnosis not present

## 2016-12-12 DIAGNOSIS — B850 Pediculosis due to Pediculus humanus capitis: Secondary | ICD-10-CM | POA: Diagnosis not present

## 2016-12-12 DIAGNOSIS — Z79899 Other long term (current) drug therapy: Secondary | ICD-10-CM | POA: Diagnosis not present

## 2016-12-12 DIAGNOSIS — F17200 Nicotine dependence, unspecified, uncomplicated: Secondary | ICD-10-CM | POA: Diagnosis not present

## 2016-12-12 DIAGNOSIS — I10 Essential (primary) hypertension: Secondary | ICD-10-CM | POA: Diagnosis not present

## 2016-12-12 DIAGNOSIS — J449 Chronic obstructive pulmonary disease, unspecified: Secondary | ICD-10-CM | POA: Diagnosis not present

## 2016-12-12 DIAGNOSIS — F419 Anxiety disorder, unspecified: Secondary | ICD-10-CM | POA: Diagnosis not present

## 2016-12-12 DIAGNOSIS — B86 Scabies: Secondary | ICD-10-CM | POA: Diagnosis not present

## 2016-12-12 DIAGNOSIS — B852 Pediculosis, unspecified: Secondary | ICD-10-CM | POA: Diagnosis not present

## 2016-12-12 DIAGNOSIS — F329 Major depressive disorder, single episode, unspecified: Secondary | ICD-10-CM | POA: Diagnosis not present

## 2016-12-12 DIAGNOSIS — Z8673 Personal history of transient ischemic attack (TIA), and cerebral infarction without residual deficits: Secondary | ICD-10-CM | POA: Diagnosis not present

## 2016-12-30 DIAGNOSIS — B86 Scabies: Secondary | ICD-10-CM | POA: Diagnosis present

## 2016-12-30 DIAGNOSIS — Z79899 Other long term (current) drug therapy: Secondary | ICD-10-CM | POA: Diagnosis not present

## 2016-12-30 DIAGNOSIS — E440 Moderate protein-calorie malnutrition: Secondary | ICD-10-CM | POA: Diagnosis not present

## 2016-12-30 DIAGNOSIS — J441 Chronic obstructive pulmonary disease with (acute) exacerbation: Secondary | ICD-10-CM | POA: Diagnosis not present

## 2016-12-30 DIAGNOSIS — E43 Unspecified severe protein-calorie malnutrition: Secondary | ICD-10-CM | POA: Diagnosis present

## 2016-12-30 DIAGNOSIS — Z8379 Family history of other diseases of the digestive system: Secondary | ICD-10-CM | POA: Diagnosis not present

## 2016-12-30 DIAGNOSIS — Z823 Family history of stroke: Secondary | ICD-10-CM | POA: Diagnosis not present

## 2016-12-30 DIAGNOSIS — B850 Pediculosis due to Pediculus humanus capitis: Secondary | ICD-10-CM | POA: Diagnosis present

## 2016-12-30 DIAGNOSIS — I96 Gangrene, not elsewhere classified: Secondary | ICD-10-CM | POA: Diagnosis present

## 2016-12-30 DIAGNOSIS — Z825 Family history of asthma and other chronic lower respiratory diseases: Secondary | ICD-10-CM | POA: Diagnosis not present

## 2016-12-30 DIAGNOSIS — I10 Essential (primary) hypertension: Secondary | ICD-10-CM | POA: Diagnosis present

## 2016-12-30 DIAGNOSIS — J449 Chronic obstructive pulmonary disease, unspecified: Secondary | ICD-10-CM | POA: Diagnosis present

## 2016-12-30 DIAGNOSIS — Z681 Body mass index (BMI) 19 or less, adult: Secondary | ICD-10-CM | POA: Diagnosis not present

## 2016-12-30 DIAGNOSIS — Z818 Family history of other mental and behavioral disorders: Secondary | ICD-10-CM | POA: Diagnosis not present

## 2016-12-30 DIAGNOSIS — R0602 Shortness of breath: Secondary | ICD-10-CM | POA: Diagnosis not present

## 2016-12-30 DIAGNOSIS — F17200 Nicotine dependence, unspecified, uncomplicated: Secondary | ICD-10-CM | POA: Diagnosis present

## 2016-12-30 DIAGNOSIS — E876 Hypokalemia: Secondary | ICD-10-CM | POA: Diagnosis present

## 2017-01-07 DIAGNOSIS — E876 Hypokalemia: Secondary | ICD-10-CM | POA: Diagnosis not present

## 2017-01-07 DIAGNOSIS — E43 Unspecified severe protein-calorie malnutrition: Secondary | ICD-10-CM | POA: Diagnosis not present

## 2017-01-07 DIAGNOSIS — Z89022 Acquired absence of left finger(s): Secondary | ICD-10-CM | POA: Diagnosis not present

## 2017-01-07 DIAGNOSIS — B86 Scabies: Secondary | ICD-10-CM | POA: Diagnosis not present

## 2017-01-07 DIAGNOSIS — I10 Essential (primary) hypertension: Secondary | ICD-10-CM | POA: Diagnosis not present

## 2017-01-07 DIAGNOSIS — J441 Chronic obstructive pulmonary disease with (acute) exacerbation: Secondary | ICD-10-CM | POA: Diagnosis not present

## 2017-01-07 DIAGNOSIS — B850 Pediculosis due to Pediculus humanus capitis: Secondary | ICD-10-CM | POA: Diagnosis not present

## 2017-01-07 DIAGNOSIS — Z4781 Encounter for orthopedic aftercare following surgical amputation: Secondary | ICD-10-CM | POA: Diagnosis not present

## 2017-01-07 DIAGNOSIS — F17218 Nicotine dependence, cigarettes, with other nicotine-induced disorders: Secondary | ICD-10-CM | POA: Diagnosis not present

## 2017-01-07 DIAGNOSIS — J449 Chronic obstructive pulmonary disease, unspecified: Secondary | ICD-10-CM | POA: Diagnosis not present

## 2017-01-11 DIAGNOSIS — Z4781 Encounter for orthopedic aftercare following surgical amputation: Secondary | ICD-10-CM | POA: Diagnosis not present

## 2017-01-11 DIAGNOSIS — E876 Hypokalemia: Secondary | ICD-10-CM | POA: Diagnosis not present

## 2017-01-11 DIAGNOSIS — I96 Gangrene, not elsewhere classified: Secondary | ICD-10-CM | POA: Diagnosis not present

## 2017-01-11 DIAGNOSIS — B86 Scabies: Secondary | ICD-10-CM | POA: Diagnosis not present

## 2017-01-11 DIAGNOSIS — J449 Chronic obstructive pulmonary disease, unspecified: Secondary | ICD-10-CM | POA: Diagnosis not present

## 2017-01-11 DIAGNOSIS — Z89022 Acquired absence of left finger(s): Secondary | ICD-10-CM | POA: Diagnosis not present

## 2017-01-11 DIAGNOSIS — I10 Essential (primary) hypertension: Secondary | ICD-10-CM | POA: Diagnosis not present

## 2017-01-11 DIAGNOSIS — E43 Unspecified severe protein-calorie malnutrition: Secondary | ICD-10-CM | POA: Diagnosis not present

## 2017-01-14 DIAGNOSIS — E43 Unspecified severe protein-calorie malnutrition: Secondary | ICD-10-CM | POA: Diagnosis not present

## 2017-01-14 DIAGNOSIS — J449 Chronic obstructive pulmonary disease, unspecified: Secondary | ICD-10-CM | POA: Diagnosis not present

## 2017-01-14 DIAGNOSIS — Z89022 Acquired absence of left finger(s): Secondary | ICD-10-CM | POA: Diagnosis not present

## 2017-01-14 DIAGNOSIS — I10 Essential (primary) hypertension: Secondary | ICD-10-CM | POA: Diagnosis not present

## 2017-01-14 DIAGNOSIS — Z4781 Encounter for orthopedic aftercare following surgical amputation: Secondary | ICD-10-CM | POA: Diagnosis not present

## 2017-01-14 DIAGNOSIS — E876 Hypokalemia: Secondary | ICD-10-CM | POA: Diagnosis not present

## 2017-01-19 DIAGNOSIS — Z4781 Encounter for orthopedic aftercare following surgical amputation: Secondary | ICD-10-CM | POA: Diagnosis not present

## 2017-01-19 DIAGNOSIS — J449 Chronic obstructive pulmonary disease, unspecified: Secondary | ICD-10-CM | POA: Diagnosis not present

## 2017-01-19 DIAGNOSIS — Z89022 Acquired absence of left finger(s): Secondary | ICD-10-CM | POA: Diagnosis not present

## 2017-01-19 DIAGNOSIS — I10 Essential (primary) hypertension: Secondary | ICD-10-CM | POA: Diagnosis not present

## 2017-01-19 DIAGNOSIS — E876 Hypokalemia: Secondary | ICD-10-CM | POA: Diagnosis not present

## 2017-01-19 DIAGNOSIS — E43 Unspecified severe protein-calorie malnutrition: Secondary | ICD-10-CM | POA: Diagnosis not present

## 2017-01-20 DIAGNOSIS — E876 Hypokalemia: Secondary | ICD-10-CM | POA: Diagnosis not present

## 2017-01-20 DIAGNOSIS — E43 Unspecified severe protein-calorie malnutrition: Secondary | ICD-10-CM | POA: Diagnosis not present

## 2017-01-20 DIAGNOSIS — Z89022 Acquired absence of left finger(s): Secondary | ICD-10-CM | POA: Diagnosis not present

## 2017-01-20 DIAGNOSIS — Z4781 Encounter for orthopedic aftercare following surgical amputation: Secondary | ICD-10-CM | POA: Diagnosis not present

## 2017-01-20 DIAGNOSIS — J449 Chronic obstructive pulmonary disease, unspecified: Secondary | ICD-10-CM | POA: Diagnosis not present

## 2017-01-20 DIAGNOSIS — I10 Essential (primary) hypertension: Secondary | ICD-10-CM | POA: Diagnosis not present

## 2017-01-24 DIAGNOSIS — E43 Unspecified severe protein-calorie malnutrition: Secondary | ICD-10-CM | POA: Diagnosis not present

## 2017-01-24 DIAGNOSIS — J449 Chronic obstructive pulmonary disease, unspecified: Secondary | ICD-10-CM | POA: Diagnosis not present

## 2017-01-24 DIAGNOSIS — Z4781 Encounter for orthopedic aftercare following surgical amputation: Secondary | ICD-10-CM | POA: Diagnosis not present

## 2017-01-24 DIAGNOSIS — I10 Essential (primary) hypertension: Secondary | ICD-10-CM | POA: Diagnosis not present

## 2017-01-24 DIAGNOSIS — Z89022 Acquired absence of left finger(s): Secondary | ICD-10-CM | POA: Diagnosis not present

## 2017-01-24 DIAGNOSIS — E876 Hypokalemia: Secondary | ICD-10-CM | POA: Diagnosis not present

## 2017-01-31 DIAGNOSIS — I10 Essential (primary) hypertension: Secondary | ICD-10-CM | POA: Diagnosis not present

## 2017-01-31 DIAGNOSIS — Z4781 Encounter for orthopedic aftercare following surgical amputation: Secondary | ICD-10-CM | POA: Diagnosis not present

## 2017-01-31 DIAGNOSIS — J449 Chronic obstructive pulmonary disease, unspecified: Secondary | ICD-10-CM | POA: Diagnosis not present

## 2017-01-31 DIAGNOSIS — E43 Unspecified severe protein-calorie malnutrition: Secondary | ICD-10-CM | POA: Diagnosis not present

## 2017-01-31 DIAGNOSIS — E876 Hypokalemia: Secondary | ICD-10-CM | POA: Diagnosis not present

## 2017-01-31 DIAGNOSIS — Z89022 Acquired absence of left finger(s): Secondary | ICD-10-CM | POA: Diagnosis not present

## 2017-02-01 DIAGNOSIS — J449 Chronic obstructive pulmonary disease, unspecified: Secondary | ICD-10-CM | POA: Diagnosis not present

## 2017-02-01 DIAGNOSIS — Z4781 Encounter for orthopedic aftercare following surgical amputation: Secondary | ICD-10-CM | POA: Diagnosis not present

## 2017-02-01 DIAGNOSIS — E43 Unspecified severe protein-calorie malnutrition: Secondary | ICD-10-CM | POA: Diagnosis not present

## 2017-02-01 DIAGNOSIS — I10 Essential (primary) hypertension: Secondary | ICD-10-CM | POA: Diagnosis not present

## 2017-02-01 DIAGNOSIS — E876 Hypokalemia: Secondary | ICD-10-CM | POA: Diagnosis not present

## 2017-02-01 DIAGNOSIS — Z89022 Acquired absence of left finger(s): Secondary | ICD-10-CM | POA: Diagnosis not present

## 2017-02-02 DIAGNOSIS — L03012 Cellulitis of left finger: Secondary | ICD-10-CM | POA: Diagnosis not present

## 2017-02-03 DIAGNOSIS — J449 Chronic obstructive pulmonary disease, unspecified: Secondary | ICD-10-CM | POA: Diagnosis not present

## 2017-02-03 DIAGNOSIS — I10 Essential (primary) hypertension: Secondary | ICD-10-CM | POA: Diagnosis not present

## 2017-02-03 DIAGNOSIS — E43 Unspecified severe protein-calorie malnutrition: Secondary | ICD-10-CM | POA: Diagnosis not present

## 2017-02-03 DIAGNOSIS — Z89022 Acquired absence of left finger(s): Secondary | ICD-10-CM | POA: Diagnosis not present

## 2017-02-03 DIAGNOSIS — E876 Hypokalemia: Secondary | ICD-10-CM | POA: Diagnosis not present

## 2017-02-03 DIAGNOSIS — Z4781 Encounter for orthopedic aftercare following surgical amputation: Secondary | ICD-10-CM | POA: Diagnosis not present

## 2017-02-08 DIAGNOSIS — I10 Essential (primary) hypertension: Secondary | ICD-10-CM | POA: Diagnosis not present

## 2017-02-08 DIAGNOSIS — J449 Chronic obstructive pulmonary disease, unspecified: Secondary | ICD-10-CM | POA: Diagnosis not present

## 2017-02-08 DIAGNOSIS — Z89022 Acquired absence of left finger(s): Secondary | ICD-10-CM | POA: Diagnosis not present

## 2017-02-08 DIAGNOSIS — E43 Unspecified severe protein-calorie malnutrition: Secondary | ICD-10-CM | POA: Diagnosis not present

## 2017-02-08 DIAGNOSIS — Z4781 Encounter for orthopedic aftercare following surgical amputation: Secondary | ICD-10-CM | POA: Diagnosis not present

## 2017-02-08 DIAGNOSIS — E876 Hypokalemia: Secondary | ICD-10-CM | POA: Diagnosis not present

## 2017-02-15 DIAGNOSIS — J449 Chronic obstructive pulmonary disease, unspecified: Secondary | ICD-10-CM | POA: Diagnosis not present

## 2017-02-15 DIAGNOSIS — I10 Essential (primary) hypertension: Secondary | ICD-10-CM | POA: Diagnosis not present

## 2017-02-15 DIAGNOSIS — Z89022 Acquired absence of left finger(s): Secondary | ICD-10-CM | POA: Diagnosis not present

## 2017-02-15 DIAGNOSIS — E43 Unspecified severe protein-calorie malnutrition: Secondary | ICD-10-CM | POA: Diagnosis not present

## 2017-02-15 DIAGNOSIS — Z4781 Encounter for orthopedic aftercare following surgical amputation: Secondary | ICD-10-CM | POA: Diagnosis not present

## 2017-02-15 DIAGNOSIS — E876 Hypokalemia: Secondary | ICD-10-CM | POA: Diagnosis not present

## 2017-02-22 DIAGNOSIS — Z89022 Acquired absence of left finger(s): Secondary | ICD-10-CM | POA: Diagnosis not present

## 2017-02-22 DIAGNOSIS — J449 Chronic obstructive pulmonary disease, unspecified: Secondary | ICD-10-CM | POA: Diagnosis not present

## 2017-02-22 DIAGNOSIS — Z4781 Encounter for orthopedic aftercare following surgical amputation: Secondary | ICD-10-CM | POA: Diagnosis not present

## 2017-02-22 DIAGNOSIS — I10 Essential (primary) hypertension: Secondary | ICD-10-CM | POA: Diagnosis not present

## 2017-02-22 DIAGNOSIS — E43 Unspecified severe protein-calorie malnutrition: Secondary | ICD-10-CM | POA: Diagnosis not present

## 2017-02-22 DIAGNOSIS — E876 Hypokalemia: Secondary | ICD-10-CM | POA: Diagnosis not present

## 2017-03-02 DIAGNOSIS — Z4781 Encounter for orthopedic aftercare following surgical amputation: Secondary | ICD-10-CM | POA: Diagnosis not present

## 2017-03-02 DIAGNOSIS — I10 Essential (primary) hypertension: Secondary | ICD-10-CM | POA: Diagnosis not present

## 2017-03-02 DIAGNOSIS — Z89022 Acquired absence of left finger(s): Secondary | ICD-10-CM | POA: Diagnosis not present

## 2017-03-02 DIAGNOSIS — E43 Unspecified severe protein-calorie malnutrition: Secondary | ICD-10-CM | POA: Diagnosis not present

## 2017-03-02 DIAGNOSIS — J449 Chronic obstructive pulmonary disease, unspecified: Secondary | ICD-10-CM | POA: Diagnosis not present

## 2017-03-02 DIAGNOSIS — E876 Hypokalemia: Secondary | ICD-10-CM | POA: Diagnosis not present

## 2017-03-03 DIAGNOSIS — F339 Major depressive disorder, recurrent, unspecified: Secondary | ICD-10-CM | POA: Diagnosis not present

## 2017-03-06 DIAGNOSIS — E876 Hypokalemia: Secondary | ICD-10-CM | POA: Diagnosis not present

## 2017-03-06 DIAGNOSIS — Z4781 Encounter for orthopedic aftercare following surgical amputation: Secondary | ICD-10-CM | POA: Diagnosis not present

## 2017-03-06 DIAGNOSIS — J449 Chronic obstructive pulmonary disease, unspecified: Secondary | ICD-10-CM | POA: Diagnosis not present

## 2017-03-06 DIAGNOSIS — E43 Unspecified severe protein-calorie malnutrition: Secondary | ICD-10-CM | POA: Diagnosis not present

## 2017-03-06 DIAGNOSIS — I10 Essential (primary) hypertension: Secondary | ICD-10-CM | POA: Diagnosis not present

## 2017-03-06 DIAGNOSIS — Z89022 Acquired absence of left finger(s): Secondary | ICD-10-CM | POA: Diagnosis not present

## 2017-03-08 DIAGNOSIS — E43 Unspecified severe protein-calorie malnutrition: Secondary | ICD-10-CM | POA: Diagnosis not present

## 2017-03-08 DIAGNOSIS — Z89022 Acquired absence of left finger(s): Secondary | ICD-10-CM | POA: Diagnosis not present

## 2017-03-08 DIAGNOSIS — J449 Chronic obstructive pulmonary disease, unspecified: Secondary | ICD-10-CM | POA: Diagnosis not present

## 2017-03-08 DIAGNOSIS — I10 Essential (primary) hypertension: Secondary | ICD-10-CM | POA: Diagnosis not present

## 2017-03-08 DIAGNOSIS — Z4781 Encounter for orthopedic aftercare following surgical amputation: Secondary | ICD-10-CM | POA: Diagnosis not present

## 2017-03-08 DIAGNOSIS — E876 Hypokalemia: Secondary | ICD-10-CM | POA: Diagnosis not present

## 2017-03-08 DIAGNOSIS — B850 Pediculosis due to Pediculus humanus capitis: Secondary | ICD-10-CM | POA: Diagnosis not present

## 2017-03-08 DIAGNOSIS — B86 Scabies: Secondary | ICD-10-CM | POA: Diagnosis not present

## 2017-03-08 DIAGNOSIS — J441 Chronic obstructive pulmonary disease with (acute) exacerbation: Secondary | ICD-10-CM | POA: Diagnosis not present

## 2017-03-08 DIAGNOSIS — F17218 Nicotine dependence, cigarettes, with other nicotine-induced disorders: Secondary | ICD-10-CM | POA: Diagnosis not present

## 2017-03-15 DIAGNOSIS — J449 Chronic obstructive pulmonary disease, unspecified: Secondary | ICD-10-CM | POA: Diagnosis not present

## 2017-03-15 DIAGNOSIS — Z4781 Encounter for orthopedic aftercare following surgical amputation: Secondary | ICD-10-CM | POA: Diagnosis not present

## 2017-03-15 DIAGNOSIS — E876 Hypokalemia: Secondary | ICD-10-CM | POA: Diagnosis not present

## 2017-03-15 DIAGNOSIS — E43 Unspecified severe protein-calorie malnutrition: Secondary | ICD-10-CM | POA: Diagnosis not present

## 2017-03-15 DIAGNOSIS — Z89022 Acquired absence of left finger(s): Secondary | ICD-10-CM | POA: Diagnosis not present

## 2017-03-15 DIAGNOSIS — I10 Essential (primary) hypertension: Secondary | ICD-10-CM | POA: Diagnosis not present

## 2017-03-22 DIAGNOSIS — I10 Essential (primary) hypertension: Secondary | ICD-10-CM | POA: Diagnosis not present

## 2017-03-22 DIAGNOSIS — Z4781 Encounter for orthopedic aftercare following surgical amputation: Secondary | ICD-10-CM | POA: Diagnosis not present

## 2017-03-22 DIAGNOSIS — E43 Unspecified severe protein-calorie malnutrition: Secondary | ICD-10-CM | POA: Diagnosis not present

## 2017-03-22 DIAGNOSIS — Z89022 Acquired absence of left finger(s): Secondary | ICD-10-CM | POA: Diagnosis not present

## 2017-03-22 DIAGNOSIS — E876 Hypokalemia: Secondary | ICD-10-CM | POA: Diagnosis not present

## 2017-03-22 DIAGNOSIS — J449 Chronic obstructive pulmonary disease, unspecified: Secondary | ICD-10-CM | POA: Diagnosis not present

## 2017-03-25 DIAGNOSIS — F329 Major depressive disorder, single episode, unspecified: Secondary | ICD-10-CM | POA: Diagnosis not present

## 2017-03-25 DIAGNOSIS — F17200 Nicotine dependence, unspecified, uncomplicated: Secondary | ICD-10-CM | POA: Diagnosis not present

## 2017-03-25 DIAGNOSIS — Z8249 Family history of ischemic heart disease and other diseases of the circulatory system: Secondary | ICD-10-CM | POA: Diagnosis not present

## 2017-03-25 DIAGNOSIS — F419 Anxiety disorder, unspecified: Secondary | ICD-10-CM | POA: Diagnosis not present

## 2017-03-25 DIAGNOSIS — I10 Essential (primary) hypertension: Secondary | ICD-10-CM | POA: Diagnosis not present

## 2017-03-25 DIAGNOSIS — J449 Chronic obstructive pulmonary disease, unspecified: Secondary | ICD-10-CM | POA: Diagnosis not present

## 2017-03-25 DIAGNOSIS — M25512 Pain in left shoulder: Secondary | ICD-10-CM | POA: Diagnosis not present

## 2017-03-25 DIAGNOSIS — Z79899 Other long term (current) drug therapy: Secondary | ICD-10-CM | POA: Diagnosis not present

## 2017-03-25 DIAGNOSIS — S6992XA Unspecified injury of left wrist, hand and finger(s), initial encounter: Secondary | ICD-10-CM | POA: Diagnosis not present

## 2017-03-25 DIAGNOSIS — M79622 Pain in left upper arm: Secondary | ICD-10-CM | POA: Diagnosis not present

## 2017-03-25 DIAGNOSIS — Z8673 Personal history of transient ischemic attack (TIA), and cerebral infarction without residual deficits: Secondary | ICD-10-CM | POA: Diagnosis not present

## 2017-03-25 DIAGNOSIS — Z7902 Long term (current) use of antithrombotics/antiplatelets: Secondary | ICD-10-CM | POA: Diagnosis not present

## 2017-03-25 DIAGNOSIS — M19012 Primary osteoarthritis, left shoulder: Secondary | ICD-10-CM | POA: Diagnosis not present

## 2017-03-29 DIAGNOSIS — J449 Chronic obstructive pulmonary disease, unspecified: Secondary | ICD-10-CM | POA: Diagnosis not present

## 2017-03-29 DIAGNOSIS — E43 Unspecified severe protein-calorie malnutrition: Secondary | ICD-10-CM | POA: Diagnosis not present

## 2017-03-29 DIAGNOSIS — Z4781 Encounter for orthopedic aftercare following surgical amputation: Secondary | ICD-10-CM | POA: Diagnosis not present

## 2017-03-29 DIAGNOSIS — E876 Hypokalemia: Secondary | ICD-10-CM | POA: Diagnosis not present

## 2017-03-29 DIAGNOSIS — Z89022 Acquired absence of left finger(s): Secondary | ICD-10-CM | POA: Diagnosis not present

## 2017-03-29 DIAGNOSIS — I10 Essential (primary) hypertension: Secondary | ICD-10-CM | POA: Diagnosis not present

## 2017-04-04 DIAGNOSIS — E43 Unspecified severe protein-calorie malnutrition: Secondary | ICD-10-CM | POA: Diagnosis not present

## 2017-04-04 DIAGNOSIS — Z4781 Encounter for orthopedic aftercare following surgical amputation: Secondary | ICD-10-CM | POA: Diagnosis not present

## 2017-04-04 DIAGNOSIS — J449 Chronic obstructive pulmonary disease, unspecified: Secondary | ICD-10-CM | POA: Diagnosis not present

## 2017-04-04 DIAGNOSIS — Z89022 Acquired absence of left finger(s): Secondary | ICD-10-CM | POA: Diagnosis not present

## 2017-04-04 DIAGNOSIS — E876 Hypokalemia: Secondary | ICD-10-CM | POA: Diagnosis not present

## 2017-04-04 DIAGNOSIS — I10 Essential (primary) hypertension: Secondary | ICD-10-CM | POA: Diagnosis not present

## 2017-04-12 DIAGNOSIS — Z4781 Encounter for orthopedic aftercare following surgical amputation: Secondary | ICD-10-CM | POA: Diagnosis not present

## 2017-04-12 DIAGNOSIS — J449 Chronic obstructive pulmonary disease, unspecified: Secondary | ICD-10-CM | POA: Diagnosis not present

## 2017-04-12 DIAGNOSIS — Z89022 Acquired absence of left finger(s): Secondary | ICD-10-CM | POA: Diagnosis not present

## 2017-04-12 DIAGNOSIS — I10 Essential (primary) hypertension: Secondary | ICD-10-CM | POA: Diagnosis not present

## 2017-04-12 DIAGNOSIS — E876 Hypokalemia: Secondary | ICD-10-CM | POA: Diagnosis not present

## 2017-04-12 DIAGNOSIS — E43 Unspecified severe protein-calorie malnutrition: Secondary | ICD-10-CM | POA: Diagnosis not present

## 2017-04-18 DIAGNOSIS — E784 Other hyperlipidemia: Secondary | ICD-10-CM | POA: Diagnosis not present

## 2017-04-18 DIAGNOSIS — M79645 Pain in left finger(s): Secondary | ICD-10-CM | POA: Diagnosis not present

## 2017-04-18 DIAGNOSIS — I10 Essential (primary) hypertension: Secondary | ICD-10-CM | POA: Diagnosis not present

## 2017-04-19 DIAGNOSIS — I10 Essential (primary) hypertension: Secondary | ICD-10-CM | POA: Diagnosis not present

## 2017-04-19 DIAGNOSIS — Z89022 Acquired absence of left finger(s): Secondary | ICD-10-CM | POA: Diagnosis not present

## 2017-04-19 DIAGNOSIS — J449 Chronic obstructive pulmonary disease, unspecified: Secondary | ICD-10-CM | POA: Diagnosis not present

## 2017-04-19 DIAGNOSIS — E43 Unspecified severe protein-calorie malnutrition: Secondary | ICD-10-CM | POA: Diagnosis not present

## 2017-04-19 DIAGNOSIS — Z4781 Encounter for orthopedic aftercare following surgical amputation: Secondary | ICD-10-CM | POA: Diagnosis not present

## 2017-04-19 DIAGNOSIS — E876 Hypokalemia: Secondary | ICD-10-CM | POA: Diagnosis not present

## 2017-04-26 DIAGNOSIS — E876 Hypokalemia: Secondary | ICD-10-CM | POA: Diagnosis not present

## 2017-04-26 DIAGNOSIS — I10 Essential (primary) hypertension: Secondary | ICD-10-CM | POA: Diagnosis not present

## 2017-04-26 DIAGNOSIS — Z89022 Acquired absence of left finger(s): Secondary | ICD-10-CM | POA: Diagnosis not present

## 2017-04-26 DIAGNOSIS — E43 Unspecified severe protein-calorie malnutrition: Secondary | ICD-10-CM | POA: Diagnosis not present

## 2017-04-26 DIAGNOSIS — Z4781 Encounter for orthopedic aftercare following surgical amputation: Secondary | ICD-10-CM | POA: Diagnosis not present

## 2017-04-26 DIAGNOSIS — J449 Chronic obstructive pulmonary disease, unspecified: Secondary | ICD-10-CM | POA: Diagnosis not present

## 2017-04-26 DIAGNOSIS — F411 Generalized anxiety disorder: Secondary | ICD-10-CM | POA: Diagnosis not present

## 2017-05-02 DIAGNOSIS — Z89022 Acquired absence of left finger(s): Secondary | ICD-10-CM | POA: Diagnosis not present

## 2017-05-02 DIAGNOSIS — E876 Hypokalemia: Secondary | ICD-10-CM | POA: Diagnosis not present

## 2017-05-02 DIAGNOSIS — I10 Essential (primary) hypertension: Secondary | ICD-10-CM | POA: Diagnosis not present

## 2017-05-02 DIAGNOSIS — J449 Chronic obstructive pulmonary disease, unspecified: Secondary | ICD-10-CM | POA: Diagnosis not present

## 2017-05-02 DIAGNOSIS — E43 Unspecified severe protein-calorie malnutrition: Secondary | ICD-10-CM | POA: Diagnosis not present

## 2017-05-02 DIAGNOSIS — Z4781 Encounter for orthopedic aftercare following surgical amputation: Secondary | ICD-10-CM | POA: Diagnosis not present

## 2017-05-07 DIAGNOSIS — E876 Hypokalemia: Secondary | ICD-10-CM | POA: Diagnosis not present

## 2017-05-07 DIAGNOSIS — J449 Chronic obstructive pulmonary disease, unspecified: Secondary | ICD-10-CM | POA: Diagnosis not present

## 2017-05-07 DIAGNOSIS — B850 Pediculosis due to Pediculus humanus capitis: Secondary | ICD-10-CM | POA: Diagnosis not present

## 2017-05-07 DIAGNOSIS — Z89022 Acquired absence of left finger(s): Secondary | ICD-10-CM | POA: Diagnosis not present

## 2017-05-07 DIAGNOSIS — I10 Essential (primary) hypertension: Secondary | ICD-10-CM | POA: Diagnosis not present

## 2017-05-07 DIAGNOSIS — E43 Unspecified severe protein-calorie malnutrition: Secondary | ICD-10-CM | POA: Diagnosis not present

## 2017-05-07 DIAGNOSIS — Z4781 Encounter for orthopedic aftercare following surgical amputation: Secondary | ICD-10-CM | POA: Diagnosis not present

## 2017-05-07 DIAGNOSIS — J441 Chronic obstructive pulmonary disease with (acute) exacerbation: Secondary | ICD-10-CM | POA: Diagnosis not present

## 2017-05-07 DIAGNOSIS — B86 Scabies: Secondary | ICD-10-CM | POA: Diagnosis not present

## 2017-05-07 DIAGNOSIS — F17218 Nicotine dependence, cigarettes, with other nicotine-induced disorders: Secondary | ICD-10-CM | POA: Diagnosis not present

## 2017-05-10 DIAGNOSIS — Z4781 Encounter for orthopedic aftercare following surgical amputation: Secondary | ICD-10-CM | POA: Diagnosis not present

## 2017-05-10 DIAGNOSIS — I10 Essential (primary) hypertension: Secondary | ICD-10-CM | POA: Diagnosis not present

## 2017-05-10 DIAGNOSIS — E876 Hypokalemia: Secondary | ICD-10-CM | POA: Diagnosis not present

## 2017-05-10 DIAGNOSIS — J449 Chronic obstructive pulmonary disease, unspecified: Secondary | ICD-10-CM | POA: Diagnosis not present

## 2017-05-10 DIAGNOSIS — Z89022 Acquired absence of left finger(s): Secondary | ICD-10-CM | POA: Diagnosis not present

## 2017-05-10 DIAGNOSIS — E43 Unspecified severe protein-calorie malnutrition: Secondary | ICD-10-CM | POA: Diagnosis not present

## 2017-05-13 DIAGNOSIS — M65342 Trigger finger, left ring finger: Secondary | ICD-10-CM | POA: Diagnosis not present

## 2017-05-13 DIAGNOSIS — M79642 Pain in left hand: Secondary | ICD-10-CM | POA: Diagnosis not present

## 2017-05-13 DIAGNOSIS — M2550 Pain in unspecified joint: Secondary | ICD-10-CM | POA: Diagnosis not present

## 2017-05-13 DIAGNOSIS — M65352 Trigger finger, left little finger: Secondary | ICD-10-CM | POA: Diagnosis not present

## 2017-05-13 DIAGNOSIS — Z720 Tobacco use: Secondary | ICD-10-CM | POA: Diagnosis not present

## 2017-05-13 DIAGNOSIS — I7301 Raynaud's syndrome with gangrene: Secondary | ICD-10-CM | POA: Diagnosis not present

## 2017-05-13 DIAGNOSIS — Z681 Body mass index (BMI) 19 or less, adult: Secondary | ICD-10-CM | POA: Diagnosis not present

## 2017-05-17 DIAGNOSIS — I10 Essential (primary) hypertension: Secondary | ICD-10-CM | POA: Diagnosis not present

## 2017-05-17 DIAGNOSIS — Z4781 Encounter for orthopedic aftercare following surgical amputation: Secondary | ICD-10-CM | POA: Diagnosis not present

## 2017-05-17 DIAGNOSIS — Z89022 Acquired absence of left finger(s): Secondary | ICD-10-CM | POA: Diagnosis not present

## 2017-05-17 DIAGNOSIS — E43 Unspecified severe protein-calorie malnutrition: Secondary | ICD-10-CM | POA: Diagnosis not present

## 2017-05-17 DIAGNOSIS — E876 Hypokalemia: Secondary | ICD-10-CM | POA: Diagnosis not present

## 2017-05-17 DIAGNOSIS — J449 Chronic obstructive pulmonary disease, unspecified: Secondary | ICD-10-CM | POA: Diagnosis not present

## 2017-05-24 DIAGNOSIS — E43 Unspecified severe protein-calorie malnutrition: Secondary | ICD-10-CM | POA: Diagnosis not present

## 2017-05-24 DIAGNOSIS — J449 Chronic obstructive pulmonary disease, unspecified: Secondary | ICD-10-CM | POA: Diagnosis not present

## 2017-05-24 DIAGNOSIS — Z4781 Encounter for orthopedic aftercare following surgical amputation: Secondary | ICD-10-CM | POA: Diagnosis not present

## 2017-05-24 DIAGNOSIS — I10 Essential (primary) hypertension: Secondary | ICD-10-CM | POA: Diagnosis not present

## 2017-05-24 DIAGNOSIS — E876 Hypokalemia: Secondary | ICD-10-CM | POA: Diagnosis not present

## 2017-05-24 DIAGNOSIS — Z89022 Acquired absence of left finger(s): Secondary | ICD-10-CM | POA: Diagnosis not present

## 2017-05-31 DIAGNOSIS — E43 Unspecified severe protein-calorie malnutrition: Secondary | ICD-10-CM | POA: Diagnosis not present

## 2017-05-31 DIAGNOSIS — Z4781 Encounter for orthopedic aftercare following surgical amputation: Secondary | ICD-10-CM | POA: Diagnosis not present

## 2017-05-31 DIAGNOSIS — I10 Essential (primary) hypertension: Secondary | ICD-10-CM | POA: Diagnosis not present

## 2017-05-31 DIAGNOSIS — E876 Hypokalemia: Secondary | ICD-10-CM | POA: Diagnosis not present

## 2017-05-31 DIAGNOSIS — J449 Chronic obstructive pulmonary disease, unspecified: Secondary | ICD-10-CM | POA: Diagnosis not present

## 2017-05-31 DIAGNOSIS — Z89022 Acquired absence of left finger(s): Secondary | ICD-10-CM | POA: Diagnosis not present

## 2017-06-07 DIAGNOSIS — I10 Essential (primary) hypertension: Secondary | ICD-10-CM | POA: Diagnosis not present

## 2017-06-07 DIAGNOSIS — J449 Chronic obstructive pulmonary disease, unspecified: Secondary | ICD-10-CM | POA: Diagnosis not present

## 2017-06-07 DIAGNOSIS — E876 Hypokalemia: Secondary | ICD-10-CM | POA: Diagnosis not present

## 2017-06-07 DIAGNOSIS — E43 Unspecified severe protein-calorie malnutrition: Secondary | ICD-10-CM | POA: Diagnosis not present

## 2017-06-07 DIAGNOSIS — Z89022 Acquired absence of left finger(s): Secondary | ICD-10-CM | POA: Diagnosis not present

## 2017-06-07 DIAGNOSIS — Z4781 Encounter for orthopedic aftercare following surgical amputation: Secondary | ICD-10-CM | POA: Diagnosis not present

## 2017-06-15 DIAGNOSIS — Z89022 Acquired absence of left finger(s): Secondary | ICD-10-CM | POA: Diagnosis not present

## 2017-06-16 DIAGNOSIS — E43 Unspecified severe protein-calorie malnutrition: Secondary | ICD-10-CM | POA: Diagnosis not present

## 2017-06-16 DIAGNOSIS — Z4781 Encounter for orthopedic aftercare following surgical amputation: Secondary | ICD-10-CM | POA: Diagnosis not present

## 2017-06-16 DIAGNOSIS — Z89022 Acquired absence of left finger(s): Secondary | ICD-10-CM | POA: Diagnosis not present

## 2017-06-16 DIAGNOSIS — E876 Hypokalemia: Secondary | ICD-10-CM | POA: Diagnosis not present

## 2017-06-16 DIAGNOSIS — J449 Chronic obstructive pulmonary disease, unspecified: Secondary | ICD-10-CM | POA: Diagnosis not present

## 2017-06-16 DIAGNOSIS — I10 Essential (primary) hypertension: Secondary | ICD-10-CM | POA: Diagnosis not present

## 2017-06-21 DIAGNOSIS — I10 Essential (primary) hypertension: Secondary | ICD-10-CM | POA: Diagnosis not present

## 2017-06-21 DIAGNOSIS — J449 Chronic obstructive pulmonary disease, unspecified: Secondary | ICD-10-CM | POA: Diagnosis not present

## 2017-06-21 DIAGNOSIS — E876 Hypokalemia: Secondary | ICD-10-CM | POA: Diagnosis not present

## 2017-06-21 DIAGNOSIS — Z4781 Encounter for orthopedic aftercare following surgical amputation: Secondary | ICD-10-CM | POA: Diagnosis not present

## 2017-06-21 DIAGNOSIS — E43 Unspecified severe protein-calorie malnutrition: Secondary | ICD-10-CM | POA: Diagnosis not present

## 2017-06-21 DIAGNOSIS — Z89022 Acquired absence of left finger(s): Secondary | ICD-10-CM | POA: Diagnosis not present

## 2017-06-28 DIAGNOSIS — F411 Generalized anxiety disorder: Secondary | ICD-10-CM | POA: Diagnosis not present

## 2017-06-30 DIAGNOSIS — I10 Essential (primary) hypertension: Secondary | ICD-10-CM | POA: Diagnosis not present

## 2017-06-30 DIAGNOSIS — Z4781 Encounter for orthopedic aftercare following surgical amputation: Secondary | ICD-10-CM | POA: Diagnosis not present

## 2017-06-30 DIAGNOSIS — E876 Hypokalemia: Secondary | ICD-10-CM | POA: Diagnosis not present

## 2017-06-30 DIAGNOSIS — Z89022 Acquired absence of left finger(s): Secondary | ICD-10-CM | POA: Diagnosis not present

## 2017-06-30 DIAGNOSIS — J449 Chronic obstructive pulmonary disease, unspecified: Secondary | ICD-10-CM | POA: Diagnosis not present

## 2017-06-30 DIAGNOSIS — E43 Unspecified severe protein-calorie malnutrition: Secondary | ICD-10-CM | POA: Diagnosis not present

## 2017-07-04 DIAGNOSIS — Z4781 Encounter for orthopedic aftercare following surgical amputation: Secondary | ICD-10-CM | POA: Diagnosis not present

## 2017-07-04 DIAGNOSIS — Z89022 Acquired absence of left finger(s): Secondary | ICD-10-CM | POA: Diagnosis not present

## 2017-07-04 DIAGNOSIS — E43 Unspecified severe protein-calorie malnutrition: Secondary | ICD-10-CM | POA: Diagnosis not present

## 2017-07-04 DIAGNOSIS — I10 Essential (primary) hypertension: Secondary | ICD-10-CM | POA: Diagnosis not present

## 2017-07-04 DIAGNOSIS — J449 Chronic obstructive pulmonary disease, unspecified: Secondary | ICD-10-CM | POA: Diagnosis not present

## 2017-07-04 DIAGNOSIS — E876 Hypokalemia: Secondary | ICD-10-CM | POA: Diagnosis not present

## 2017-07-13 DIAGNOSIS — M21242 Flexion deformity, left finger joints: Secondary | ICD-10-CM | POA: Diagnosis not present

## 2017-07-13 DIAGNOSIS — M79642 Pain in left hand: Secondary | ICD-10-CM | POA: Diagnosis not present

## 2017-07-13 DIAGNOSIS — M869 Osteomyelitis, unspecified: Secondary | ICD-10-CM | POA: Diagnosis not present

## 2017-07-19 DIAGNOSIS — E46 Unspecified protein-calorie malnutrition: Secondary | ICD-10-CM | POA: Diagnosis not present

## 2017-07-19 DIAGNOSIS — I7389 Other specified peripheral vascular diseases: Secondary | ICD-10-CM | POA: Diagnosis not present

## 2017-07-19 DIAGNOSIS — E7849 Other hyperlipidemia: Secondary | ICD-10-CM | POA: Diagnosis not present

## 2017-07-19 DIAGNOSIS — J441 Chronic obstructive pulmonary disease with (acute) exacerbation: Secondary | ICD-10-CM | POA: Diagnosis not present

## 2017-07-19 DIAGNOSIS — I10 Essential (primary) hypertension: Secondary | ICD-10-CM | POA: Diagnosis not present

## 2017-09-30 DIAGNOSIS — F411 Generalized anxiety disorder: Secondary | ICD-10-CM | POA: Diagnosis not present

## 2017-10-20 DIAGNOSIS — Z0000 Encounter for general adult medical examination without abnormal findings: Secondary | ICD-10-CM | POA: Diagnosis not present

## 2017-10-20 DIAGNOSIS — Z1389 Encounter for screening for other disorder: Secondary | ICD-10-CM | POA: Diagnosis not present

## 2017-10-20 DIAGNOSIS — E7849 Other hyperlipidemia: Secondary | ICD-10-CM | POA: Diagnosis not present

## 2017-10-20 DIAGNOSIS — E46 Unspecified protein-calorie malnutrition: Secondary | ICD-10-CM | POA: Diagnosis not present

## 2017-10-20 DIAGNOSIS — J441 Chronic obstructive pulmonary disease with (acute) exacerbation: Secondary | ICD-10-CM | POA: Diagnosis not present

## 2017-10-20 DIAGNOSIS — I7389 Other specified peripheral vascular diseases: Secondary | ICD-10-CM | POA: Diagnosis not present

## 2017-10-20 DIAGNOSIS — I10 Essential (primary) hypertension: Secondary | ICD-10-CM | POA: Diagnosis not present

## 2017-12-16 DIAGNOSIS — F339 Major depressive disorder, recurrent, unspecified: Secondary | ICD-10-CM | POA: Diagnosis not present

## 2018-01-18 DIAGNOSIS — L03114 Cellulitis of left upper limb: Secondary | ICD-10-CM | POA: Diagnosis not present

## 2018-01-18 DIAGNOSIS — J441 Chronic obstructive pulmonary disease with (acute) exacerbation: Secondary | ICD-10-CM | POA: Diagnosis not present

## 2018-01-18 DIAGNOSIS — I7389 Other specified peripheral vascular diseases: Secondary | ICD-10-CM | POA: Diagnosis not present

## 2018-01-18 DIAGNOSIS — I10 Essential (primary) hypertension: Secondary | ICD-10-CM | POA: Diagnosis not present

## 2018-01-18 DIAGNOSIS — E7849 Other hyperlipidemia: Secondary | ICD-10-CM | POA: Diagnosis not present

## 2018-03-06 DIAGNOSIS — F411 Generalized anxiety disorder: Secondary | ICD-10-CM | POA: Diagnosis not present

## 2018-04-20 DIAGNOSIS — E7849 Other hyperlipidemia: Secondary | ICD-10-CM | POA: Diagnosis not present

## 2018-04-20 DIAGNOSIS — L03114 Cellulitis of left upper limb: Secondary | ICD-10-CM | POA: Diagnosis not present

## 2018-04-20 DIAGNOSIS — J441 Chronic obstructive pulmonary disease with (acute) exacerbation: Secondary | ICD-10-CM | POA: Diagnosis not present

## 2018-04-20 DIAGNOSIS — I7389 Other specified peripheral vascular diseases: Secondary | ICD-10-CM | POA: Diagnosis not present

## 2018-04-20 DIAGNOSIS — I10 Essential (primary) hypertension: Secondary | ICD-10-CM | POA: Diagnosis not present

## 2018-04-24 DIAGNOSIS — M24542 Contracture, left hand: Secondary | ICD-10-CM | POA: Diagnosis not present

## 2018-04-27 DIAGNOSIS — Z7902 Long term (current) use of antithrombotics/antiplatelets: Secondary | ICD-10-CM | POA: Diagnosis not present

## 2018-04-27 DIAGNOSIS — L0889 Other specified local infections of the skin and subcutaneous tissue: Secondary | ICD-10-CM | POA: Diagnosis not present

## 2018-04-27 DIAGNOSIS — Z8673 Personal history of transient ischemic attack (TIA), and cerebral infarction without residual deficits: Secondary | ICD-10-CM | POA: Diagnosis not present

## 2018-04-27 DIAGNOSIS — J449 Chronic obstructive pulmonary disease, unspecified: Secondary | ICD-10-CM | POA: Diagnosis not present

## 2018-04-27 DIAGNOSIS — F329 Major depressive disorder, single episode, unspecified: Secondary | ICD-10-CM | POA: Diagnosis not present

## 2018-04-27 DIAGNOSIS — Z79899 Other long term (current) drug therapy: Secondary | ICD-10-CM | POA: Diagnosis not present

## 2018-04-27 DIAGNOSIS — I10 Essential (primary) hypertension: Secondary | ICD-10-CM | POA: Diagnosis not present

## 2018-04-27 DIAGNOSIS — Z880 Allergy status to penicillin: Secondary | ICD-10-CM | POA: Diagnosis not present

## 2018-04-27 DIAGNOSIS — Z90710 Acquired absence of both cervix and uterus: Secondary | ICD-10-CM | POA: Diagnosis not present

## 2018-04-27 DIAGNOSIS — M20092 Other deformity of left finger(s): Secondary | ICD-10-CM | POA: Diagnosis not present

## 2018-04-27 DIAGNOSIS — G4700 Insomnia, unspecified: Secondary | ICD-10-CM | POA: Diagnosis not present

## 2018-04-27 DIAGNOSIS — F419 Anxiety disorder, unspecified: Secondary | ICD-10-CM | POA: Diagnosis not present

## 2018-04-27 DIAGNOSIS — Z89022 Acquired absence of left finger(s): Secondary | ICD-10-CM | POA: Diagnosis not present

## 2018-04-28 DIAGNOSIS — Z79899 Other long term (current) drug therapy: Secondary | ICD-10-CM | POA: Diagnosis not present

## 2018-04-28 DIAGNOSIS — L089 Local infection of the skin and subcutaneous tissue, unspecified: Secondary | ICD-10-CM | POA: Diagnosis not present

## 2018-04-28 DIAGNOSIS — Z89022 Acquired absence of left finger(s): Secondary | ICD-10-CM | POA: Diagnosis not present

## 2018-04-28 DIAGNOSIS — I10 Essential (primary) hypertension: Secondary | ICD-10-CM | POA: Diagnosis not present

## 2018-04-28 DIAGNOSIS — L0889 Other specified local infections of the skin and subcutaneous tissue: Secondary | ICD-10-CM | POA: Diagnosis not present

## 2018-04-28 DIAGNOSIS — M20092 Other deformity of left finger(s): Secondary | ICD-10-CM | POA: Diagnosis not present

## 2018-04-28 DIAGNOSIS — J449 Chronic obstructive pulmonary disease, unspecified: Secondary | ICD-10-CM | POA: Diagnosis not present

## 2018-04-28 DIAGNOSIS — M24542 Contracture, left hand: Secondary | ICD-10-CM | POA: Diagnosis not present

## 2018-04-28 DIAGNOSIS — M24541 Contracture, right hand: Secondary | ICD-10-CM | POA: Diagnosis not present

## 2018-05-29 DIAGNOSIS — F411 Generalized anxiety disorder: Secondary | ICD-10-CM | POA: Diagnosis not present

## 2018-08-21 DIAGNOSIS — F339 Major depressive disorder, recurrent, unspecified: Secondary | ICD-10-CM | POA: Diagnosis not present

## 2018-09-26 DIAGNOSIS — I7389 Other specified peripheral vascular diseases: Secondary | ICD-10-CM | POA: Diagnosis not present

## 2018-09-26 DIAGNOSIS — E7849 Other hyperlipidemia: Secondary | ICD-10-CM | POA: Diagnosis not present

## 2018-09-26 DIAGNOSIS — J441 Chronic obstructive pulmonary disease with (acute) exacerbation: Secondary | ICD-10-CM | POA: Diagnosis not present

## 2018-09-26 DIAGNOSIS — I10 Essential (primary) hypertension: Secondary | ICD-10-CM | POA: Diagnosis not present

## 2018-11-24 DIAGNOSIS — F339 Major depressive disorder, recurrent, unspecified: Secondary | ICD-10-CM | POA: Diagnosis not present

## 2019-01-23 DIAGNOSIS — Z8249 Family history of ischemic heart disease and other diseases of the circulatory system: Secondary | ICD-10-CM | POA: Diagnosis not present

## 2019-01-23 DIAGNOSIS — W010XXA Fall on same level from slipping, tripping and stumbling without subsequent striking against object, initial encounter: Secondary | ICD-10-CM | POA: Diagnosis not present

## 2019-01-23 DIAGNOSIS — F329 Major depressive disorder, single episode, unspecified: Secondary | ICD-10-CM | POA: Diagnosis not present

## 2019-01-23 DIAGNOSIS — Z7902 Long term (current) use of antithrombotics/antiplatelets: Secondary | ICD-10-CM | POA: Diagnosis not present

## 2019-01-23 DIAGNOSIS — J449 Chronic obstructive pulmonary disease, unspecified: Secondary | ICD-10-CM | POA: Diagnosis not present

## 2019-01-23 DIAGNOSIS — Z8673 Personal history of transient ischemic attack (TIA), and cerebral infarction without residual deficits: Secondary | ICD-10-CM | POA: Diagnosis not present

## 2019-01-23 DIAGNOSIS — Z79899 Other long term (current) drug therapy: Secondary | ICD-10-CM | POA: Diagnosis not present

## 2019-01-23 DIAGNOSIS — Z880 Allergy status to penicillin: Secondary | ICD-10-CM | POA: Diagnosis not present

## 2019-01-23 DIAGNOSIS — S52502A Unspecified fracture of the lower end of left radius, initial encounter for closed fracture: Secondary | ICD-10-CM | POA: Diagnosis not present

## 2019-01-23 DIAGNOSIS — F419 Anxiety disorder, unspecified: Secondary | ICD-10-CM | POA: Diagnosis not present

## 2019-01-23 DIAGNOSIS — I10 Essential (primary) hypertension: Secondary | ICD-10-CM | POA: Diagnosis not present

## 2019-01-23 DIAGNOSIS — S52592A Other fractures of lower end of left radius, initial encounter for closed fracture: Secondary | ICD-10-CM | POA: Diagnosis not present

## 2019-01-23 DIAGNOSIS — F17200 Nicotine dependence, unspecified, uncomplicated: Secondary | ICD-10-CM | POA: Diagnosis not present

## 2019-06-21 DIAGNOSIS — J441 Chronic obstructive pulmonary disease with (acute) exacerbation: Secondary | ICD-10-CM | POA: Diagnosis not present

## 2019-06-21 DIAGNOSIS — Z0000 Encounter for general adult medical examination without abnormal findings: Secondary | ICD-10-CM | POA: Diagnosis not present

## 2019-06-21 DIAGNOSIS — I7389 Other specified peripheral vascular diseases: Secondary | ICD-10-CM | POA: Diagnosis not present

## 2019-06-21 DIAGNOSIS — I10 Essential (primary) hypertension: Secondary | ICD-10-CM | POA: Diagnosis not present

## 2019-06-21 DIAGNOSIS — Z1389 Encounter for screening for other disorder: Secondary | ICD-10-CM | POA: Diagnosis not present

## 2019-06-21 DIAGNOSIS — E7849 Other hyperlipidemia: Secondary | ICD-10-CM | POA: Diagnosis not present

## 2020-10-22 DIAGNOSIS — Z0000 Encounter for general adult medical examination without abnormal findings: Secondary | ICD-10-CM | POA: Diagnosis not present

## 2020-10-22 DIAGNOSIS — I10 Essential (primary) hypertension: Secondary | ICD-10-CM | POA: Diagnosis not present

## 2020-10-22 DIAGNOSIS — F411 Generalized anxiety disorder: Secondary | ICD-10-CM | POA: Diagnosis not present

## 2020-10-22 DIAGNOSIS — J449 Chronic obstructive pulmonary disease, unspecified: Secondary | ICD-10-CM | POA: Diagnosis not present

## 2020-11-27 DIAGNOSIS — L600 Ingrowing nail: Secondary | ICD-10-CM | POA: Diagnosis not present

## 2020-12-11 DIAGNOSIS — L03032 Cellulitis of left toe: Secondary | ICD-10-CM | POA: Diagnosis not present

## 2021-01-14 DIAGNOSIS — L600 Ingrowing nail: Secondary | ICD-10-CM | POA: Diagnosis not present

## 2021-01-21 DIAGNOSIS — F411 Generalized anxiety disorder: Secondary | ICD-10-CM | POA: Diagnosis not present

## 2021-01-21 DIAGNOSIS — I10 Essential (primary) hypertension: Secondary | ICD-10-CM | POA: Diagnosis not present

## 2021-01-21 DIAGNOSIS — J441 Chronic obstructive pulmonary disease with (acute) exacerbation: Secondary | ICD-10-CM | POA: Diagnosis not present

## 2021-01-21 DIAGNOSIS — M810 Age-related osteoporosis without current pathological fracture: Secondary | ICD-10-CM | POA: Diagnosis not present

## 2021-01-21 DIAGNOSIS — J449 Chronic obstructive pulmonary disease, unspecified: Secondary | ICD-10-CM | POA: Diagnosis not present

## 2021-03-30 DIAGNOSIS — L600 Ingrowing nail: Secondary | ICD-10-CM | POA: Diagnosis not present

## 2021-06-18 DIAGNOSIS — J449 Chronic obstructive pulmonary disease, unspecified: Secondary | ICD-10-CM | POA: Diagnosis not present

## 2021-06-18 DIAGNOSIS — E7849 Other hyperlipidemia: Secondary | ICD-10-CM | POA: Diagnosis not present

## 2021-06-18 DIAGNOSIS — F411 Generalized anxiety disorder: Secondary | ICD-10-CM | POA: Diagnosis not present

## 2022-05-04 DIAGNOSIS — F411 Generalized anxiety disorder: Secondary | ICD-10-CM | POA: Diagnosis not present

## 2022-05-04 DIAGNOSIS — J449 Chronic obstructive pulmonary disease, unspecified: Secondary | ICD-10-CM | POA: Diagnosis not present

## 2022-05-04 DIAGNOSIS — E7849 Other hyperlipidemia: Secondary | ICD-10-CM | POA: Diagnosis not present

## 2022-05-04 DIAGNOSIS — I10 Essential (primary) hypertension: Secondary | ICD-10-CM | POA: Diagnosis not present

## 2022-05-04 DIAGNOSIS — Z1331 Encounter for screening for depression: Secondary | ICD-10-CM | POA: Diagnosis not present

## 2022-05-04 DIAGNOSIS — Z0000 Encounter for general adult medical examination without abnormal findings: Secondary | ICD-10-CM | POA: Diagnosis not present

## 2022-05-07 DIAGNOSIS — Z0000 Encounter for general adult medical examination without abnormal findings: Secondary | ICD-10-CM | POA: Diagnosis not present

## 2022-05-07 DIAGNOSIS — Z1331 Encounter for screening for depression: Secondary | ICD-10-CM | POA: Diagnosis not present

## 2022-05-07 DIAGNOSIS — E7849 Other hyperlipidemia: Secondary | ICD-10-CM | POA: Diagnosis not present

## 2022-05-07 DIAGNOSIS — J449 Chronic obstructive pulmonary disease, unspecified: Secondary | ICD-10-CM | POA: Diagnosis not present

## 2022-05-07 DIAGNOSIS — I10 Essential (primary) hypertension: Secondary | ICD-10-CM | POA: Diagnosis not present

## 2022-05-07 DIAGNOSIS — F411 Generalized anxiety disorder: Secondary | ICD-10-CM | POA: Diagnosis not present

## 2022-07-15 DIAGNOSIS — I739 Peripheral vascular disease, unspecified: Secondary | ICD-10-CM | POA: Diagnosis not present

## 2022-07-15 DIAGNOSIS — L603 Nail dystrophy: Secondary | ICD-10-CM | POA: Diagnosis not present

## 2022-07-15 DIAGNOSIS — M79672 Pain in left foot: Secondary | ICD-10-CM | POA: Diagnosis not present

## 2022-07-16 ENCOUNTER — Other Ambulatory Visit (HOSPITAL_COMMUNITY): Payer: Self-pay | Admitting: Podiatry

## 2022-07-16 DIAGNOSIS — I739 Peripheral vascular disease, unspecified: Secondary | ICD-10-CM

## 2022-08-03 ENCOUNTER — Encounter (HOSPITAL_COMMUNITY): Payer: Self-pay

## 2022-08-03 ENCOUNTER — Ambulatory Visit (HOSPITAL_COMMUNITY): Admission: RE | Admit: 2022-08-03 | Payer: Medicare Other | Source: Ambulatory Visit

## 2022-08-04 DIAGNOSIS — E7849 Other hyperlipidemia: Secondary | ICD-10-CM | POA: Diagnosis not present

## 2022-08-04 DIAGNOSIS — J440 Chronic obstructive pulmonary disease with acute lower respiratory infection: Secondary | ICD-10-CM | POA: Diagnosis not present

## 2022-08-04 DIAGNOSIS — I10 Essential (primary) hypertension: Secondary | ICD-10-CM | POA: Diagnosis not present

## 2022-08-04 DIAGNOSIS — F411 Generalized anxiety disorder: Secondary | ICD-10-CM | POA: Diagnosis not present

## 2022-08-18 ENCOUNTER — Other Ambulatory Visit: Payer: Self-pay | Admitting: *Deleted

## 2022-08-18 DIAGNOSIS — M79606 Pain in leg, unspecified: Secondary | ICD-10-CM

## 2022-08-25 ENCOUNTER — Encounter: Payer: Self-pay | Admitting: Vascular Surgery

## 2022-09-08 ENCOUNTER — Other Ambulatory Visit: Payer: Self-pay

## 2022-09-08 ENCOUNTER — Encounter: Payer: Self-pay | Admitting: Vascular Surgery

## 2022-09-08 ENCOUNTER — Ambulatory Visit (INDEPENDENT_AMBULATORY_CARE_PROVIDER_SITE_OTHER): Payer: Medicare Other | Admitting: Vascular Surgery

## 2022-09-08 ENCOUNTER — Ambulatory Visit (INDEPENDENT_AMBULATORY_CARE_PROVIDER_SITE_OTHER): Payer: Medicare Other

## 2022-09-08 VITALS — BP 109/73 | HR 83 | Temp 98.4°F | Ht 68.0 in | Wt 111.4 lb

## 2022-09-08 DIAGNOSIS — I70222 Atherosclerosis of native arteries of extremities with rest pain, left leg: Secondary | ICD-10-CM

## 2022-09-08 DIAGNOSIS — M79606 Pain in leg, unspecified: Secondary | ICD-10-CM

## 2022-09-08 NOTE — Progress Notes (Signed)
[facility] [place]  Patient name: Kristen Trujillo MRN: [id number] DOB: Jul 12, 1956 Sex: female  REASON FOR CONSULT: Evaluation critical limb ischemia left lower extremity  HPI: Kristen Trujillo is a 67 y.o. female, who is here today for evaluation of critical limb ischemia.  She reports having an ingrown toenail and removal of this.  She has had pain since that time but was able to successfully heal this.  She has had persistent rest pain in her left foot.  This awakens her at night and she has found that holding her foot dependent does give her some temporary relief.  She has dependent rubor and pallor in her left foot.  He has no right foot symptoms.  She is a long-term cigarette smoker but quit 1 month ago.  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  Apparently there was no specific workup at that time.  She underwent amputation of the distal two thirds of her left finger.  She subsequently developed a contracture in her fourth and fifth fingers and underwent subsequent amputation of her fourth and fifth fingers as well.  She is right-handed.  He denies any cardiac history.  She does have history of emphysema related to her long-term cigarette smoking.  Past Medical History:  Diagnosis Date   Anxiety    Depression    Emphysema of lung ([place])    Hypertension     Family History  Problem Relation Age of Onset   Hypertension Mother     SOCIAL HISTORY: Social History   Socioeconomic History   Marital status: Single    Spouse name: Not on file   Number of children: Not on file   Years of education: Not on file   Highest education level: Not on file  Occupational History   Not on file  Tobacco Use   Smoking status: Former    Packs/day: 1.00    Years: 50.00    Total pack years: 50.00    Types: Cigarettes    Quit date: 08/2022    Years since quitting: 0.0   Smokeless  tobacco: Never  Vaping Use   Vaping Use: Never used  Substance and Sexual Activity   Alcohol use: No   Drug use: No   Sexual activity: Not on file  Other Topics Concern   Not on file  Social History Narrative   ** Merged History Encounter **       Social Determinants of Health   Financial Resource Strain: Not on file  Food Insecurity: Not on file  Transportation Needs: Not on file  Physical Activity: Not on file  Stress: Not on file  Social Connections: Not on file  Intimate Partner Violence: Not on file    Allergies  Allergen Reactions   Penicillin G     Current Outpatient Medications  Medication Sig Dispense Refill   acetaminophen (PAIN RELIEVER EXTRA STRENGTH) 500 MG tablet Take 500 mg by mouth daily as needed. For pain     albuterol (PROVENTIL HFA;VENTOLIN HFA) 108 (90 BASE) MCG/ACT inhaler Inhale 2 puffs into the lungs every 6 (six) hours as needed. For shortness of breath. (Has not taken in over 2 months)     benazepril-hydrochlorthiazide (LOTENSIN HCT) 20-25 MG tablet Take 1 tablet by mouth daily.     mirtazapine (REMERON) 30 MG tablet Take 30 mg by mouth at bedtime.     Aspirin-Salicylamide-Caffeine (BC HEADACHE) 325-95-16 MG TABS Take  1-3 packets by mouth daily as needed. For pain (Patient not taking: Reported on 09/08/2022)     diazepam (VALIUM) 10 MG tablet Take 10 mg by mouth 2 (two) times daily. (Patient not taking: Reported on 09/08/2022)     FLUoxetine (PROZAC) 20 MG capsule Take 20 mg by mouth every morning. (Patient not taking: Reported on 09/08/2022)     No current facility-administered medications for this visit.    REVIEW OF SYSTEMS:  [X]  denotes positive finding, [ ]  denotes negative finding Cardiac  Comments:  Chest pain or chest pressure:    Shortness of breath upon exertion:    Short of breath when lying flat:    Irregular heart rhythm:        Vascular    Pain in calf, thigh, or hip brought on by ambulation: x   Pain in feet at night that wakes  you up from your sleep:  x   Blood clot in your veins:    Leg swelling:         Pulmonary    Oxygen at home:    Productive cough:     Wheezing:  x       Neurologic    Sudden weakness in arms or legs:     Sudden numbness in arms or legs:     Sudden onset of difficulty speaking or slurred speech:    Temporary loss of vision in one eye:     Problems with dizziness:         Gastrointestinal    Blood in stool:     Vomited blood:         Genitourinary    Burning when urinating:     Blood in urine:        Psychiatric    Major depression:         Hematologic    Bleeding problems:    Problems with blood clotting too easily:        Skin    Rashes or ulcers:        Constitutional    Fever or chills:      PHYSICAL EXAM: Vitals:   09/08/22 1314  BP: 109/73  Pulse: 83  Temp: 98.4 F (36.9 C)  SpO2: 91%  Weight: 111 lb 6.4 oz (50.5 kg)  Height: 5\' 8"  (1.727 m)    GENERAL: The patient is a well-nourished female, in no acute distress. The vital signs are documented above. CARDIOVASCULAR: 2+ radial pulses bilaterally.  2+ right femoral pulse, 2+ right popliteal pulse, 2+ dorsalis pedis pulse on the right.  On the left I do not palpate a femoral pulse or distal pulses. PULMONARY: There is good air exchange  MUSCULOSKELETAL: There are no major deformities or cyanosis.  Healed amputation of third fourth and fifth fingers on the left NEUROLOGIC: No focal weakness or paresthesias are detected. SKIN: There are no ulcers or rashes noted.  Dependent rubor in her left foot PSYCHIATRIC: The patient has a normal affect.  DATA:  Noninvasive studies today reveal normal ankle arm index and normal triphasic waveforms in her right foot.  On the left she has dampened monophasic flow with a ankle arm index of 0.40  MEDICAL ISSUES: Critical limb ischemia on the left.  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  She has a normal right femoral pulse and absent left femoral  pulse.  I have recommended arteriography for further evaluation.  I explained that there is a high likelihood that  this could be treated with endovascular means.  If this is too extensive, I did discuss the possibility of femoral to femoral bypass.  We will coordinate outpatient arteriography and possible endovascular treatment as soon as possible with one of my partners at [employer], MD [facility] [phone] Pager [phone]  Note: Portions of this report may have been transcribed using voice recognition software.  Every effort has been made to ensure accuracy; however, inadvertent computerized transcription errors may still be present.

## 2022-09-15 ENCOUNTER — Encounter (HOSPITAL_COMMUNITY)
Admission: RE | Disposition: A | Discharge status: Home / Self Care | Payer: Self-pay | Source: Ambulatory Visit | Attending: Vascular Surgery

## 2022-09-15 ENCOUNTER — Other Ambulatory Visit: Payer: Self-pay

## 2022-09-15 ENCOUNTER — Encounter (HOSPITAL_COMMUNITY): Payer: Self-pay | Admitting: Vascular Surgery

## 2022-09-15 ENCOUNTER — Ambulatory Visit (HOSPITAL_COMMUNITY)
Admission: RE | Admit: 2022-09-15 | Discharge: 2022-09-15 | Disposition: A | Discharge status: Home / Self Care | Payer: Medicare Other | Source: Ambulatory Visit | Attending: Vascular Surgery | Admitting: Vascular Surgery

## 2022-09-15 DIAGNOSIS — I70222 Atherosclerosis of native arteries of extremities with rest pain, left leg: Secondary | ICD-10-CM

## 2022-09-15 DIAGNOSIS — Z87891 Personal history of nicotine dependence: Secondary | ICD-10-CM | POA: Insufficient documentation

## 2022-09-15 DIAGNOSIS — J439 Emphysema, unspecified: Secondary | ICD-10-CM | POA: Insufficient documentation

## 2022-09-15 DIAGNOSIS — I998 Other disorder of circulatory system: Secondary | ICD-10-CM

## 2022-09-15 HISTORY — PX: ABDOMINAL AORTOGRAM W/LOWER EXTREMITY: CATH118223

## 2022-09-15 LAB — POCT I-STAT, CHEM 8
BUN: 7 mg/dL — ABNORMAL LOW (ref 8–23)
BUN: 9 mg/dL (ref 8–23)
Calcium, Ion: 1.03 mmol/L — ABNORMAL LOW (ref 1.15–1.40)
Calcium, Ion: 1.15 mmol/L (ref 1.15–1.40)
Chloride: 100 mmol/L (ref 98–111)
Chloride: 101 mmol/L (ref 98–111)
Creatinine, Ser: 1 mg/dL (ref 0.44–1.00)
Creatinine, Ser: 1.1 mg/dL — ABNORMAL HIGH (ref 0.44–1.00)
Glucose, Bld: 141 mg/dL — ABNORMAL HIGH (ref 70–99)
Glucose, Bld: 144 mg/dL — ABNORMAL HIGH (ref 70–99)
HCT: 44 % (ref 36.0–46.0)
HCT: 46 % (ref 36.0–46.0)
Hemoglobin: 15 g/dL (ref 12.0–15.0)
Hemoglobin: 15.6 g/dL — ABNORMAL HIGH (ref 12.0–15.0)
Potassium: 3.8 mmol/L (ref 3.5–5.1)
Potassium: 5.4 mmol/L — ABNORMAL HIGH (ref 3.5–5.1)
Sodium: 135 mmol/L (ref 135–145)
Sodium: 139 mmol/L (ref 135–145)
TCO2: 29 mmol/L (ref 22–32)
TCO2: 29 mmol/L (ref 22–32)

## 2022-09-15 SURGERY — ABDOMINAL AORTOGRAM W/LOWER EXTREMITY
Anesthesia: LOCAL | Laterality: Bilateral

## 2022-09-15 MED ORDER — FENTANYL CITRATE (PF) 100 MCG/2ML IJ SOLN
INTRAMUSCULAR | Status: DC | PRN
  Administered 2022-09-15: 50 ug via INTRAVENOUS

## 2022-09-15 MED ORDER — SODIUM CHLORIDE 0.9% FLUSH: 3.0000 mL | Freq: Two times a day (BID) | INTRAVENOUS | Status: DC

## 2022-09-15 MED ORDER — ACETAMINOPHEN 325 MG PO TABS: 650.0000 mg | ORAL_TABLET | ORAL | Status: DC | PRN

## 2022-09-15 MED ORDER — ATORVASTATIN CALCIUM 10 MG PO TABS: 10.0000 mg | ORAL_TABLET | Freq: Every day | ORAL | Status: DC

## 2022-09-15 MED ORDER — MIDAZOLAM HCL 2 MG/2ML IJ SOLN
INTRAMUSCULAR | Status: AC
  Filled 2022-09-15: qty 2

## 2022-09-15 MED ORDER — HEPARIN (PORCINE) IN NACL 1000-0.9 UT/500ML-% IV SOLN
INTRAVENOUS | Status: DC | PRN
  Administered 2022-09-15 (×2): 500 mL

## 2022-09-15 MED ORDER — MIDAZOLAM HCL 2 MG/2ML IJ SOLN
INTRAMUSCULAR | Status: DC | PRN
  Administered 2022-09-15: 1 mg via INTRAVENOUS

## 2022-09-15 MED ORDER — SODIUM CHLORIDE 0.9 % IV SOLN: 250.0000 mL | INTRAVENOUS | Status: DC | PRN

## 2022-09-15 MED ORDER — SODIUM CHLORIDE 0.9 % IV SOLN: INTRAVENOUS | Status: DC

## 2022-09-15 MED ORDER — ATORVASTATIN CALCIUM 10 MG PO TABS: 10.0000 mg | ORAL_TABLET | Freq: Every day | ORAL | 11 refills | Status: AC

## 2022-09-15 MED ORDER — ONDANSETRON HCL 4 MG/2ML IJ SOLN: 4.0000 mg | Freq: Four times a day (QID) | INTRAMUSCULAR | Status: DC | PRN

## 2022-09-15 MED ORDER — ASPIRIN 81 MG PO TBEC: 81.0000 mg | DELAYED_RELEASE_TABLET | Freq: Every day | ORAL | 2 refills | Status: AC

## 2022-09-15 MED ORDER — SODIUM CHLORIDE 0.9 % WEIGHT BASED INFUSION: 1.0000 mL/kg/h | INTRAVENOUS | Status: DC

## 2022-09-15 MED ORDER — LIDOCAINE HCL (PF) 1 % IJ SOLN
INTRAMUSCULAR | Status: AC
  Filled 2022-09-15: qty 30

## 2022-09-15 MED ORDER — HYDRALAZINE HCL 20 MG/ML IJ SOLN: 5.0000 mg | INTRAMUSCULAR | Status: DC | PRN

## 2022-09-15 MED ORDER — SODIUM CHLORIDE 0.9% FLUSH: 3.0000 mL | INTRAVENOUS | Status: DC | PRN

## 2022-09-15 MED ORDER — LABETALOL HCL 5 MG/ML IV SOLN: 10.0000 mg | INTRAVENOUS | Status: DC | PRN

## 2022-09-15 MED ORDER — FENTANYL CITRATE (PF) 100 MCG/2ML IJ SOLN
INTRAMUSCULAR | Status: AC
  Filled 2022-09-15: qty 2

## 2022-09-15 MED ORDER — LIDOCAINE HCL (PF) 1 % IJ SOLN
INTRAMUSCULAR | Status: DC | PRN
  Administered 2022-09-15: 10 mL

## 2022-09-15 MED ORDER — ASPIRIN 81 MG PO TBEC: 81.0000 mg | DELAYED_RELEASE_TABLET | Freq: Every day | ORAL | Status: DC

## 2022-09-15 MED ORDER — IODIXANOL 320 MG/ML IV SOLN
INTRAVENOUS | Status: DC | PRN
  Administered 2022-09-15: 112 mL

## 2022-09-15 SURGICAL SUPPLY — 10 items
CATH OMNI FLUSH 5F 65CM (CATHETERS) IMPLANT
DEVICE CLOSURE MYNXGRIP 5F (Vascular Products) IMPLANT
KIT MICROPUNCTURE NIT STIFF (SHEATH) IMPLANT
KIT PV (KITS) ×1 IMPLANT
SHEATH PINNACLE 5F 10CM (SHEATH) IMPLANT
SHEATH PROBE COVER 6X72 (BAG) IMPLANT
SYR MEDRAD MARK V 150ML (SYRINGE) IMPLANT
TRANSDUCER W/STOPCOCK (MISCELLANEOUS) ×1 IMPLANT
TRAY PV CATH (CUSTOM PROCEDURE TRAY) ×1 IMPLANT
WIRE BENTSON .035X145CM (WIRE) IMPLANT

## 2022-09-15 NOTE — Op Note (Signed)
    Patient name: Kristen Trujillo MRN: [id number] DOB: 1956-05-07 Sex: female  09/15/2022 Pre-operative Diagnosis: Left lower extremity critical ischemia with rest pain Post-operative diagnosis:  Same Surgeon:  Broadus John, MD Procedure Performed: 1.  Ultrasound-guided micropuncture access of the right common femoral artery 2.  Aortogram 3.  Bilateral lower extremity angiogram 4.  Device assisted closure-Mynx 5.  Contrast volume 112 mL 6.  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 Darlin Priestly clinic complaining of left lower extremity rest pain.  ABI demonstrated monophasic waveforms at the level of the ankle.  On physical exam, she had a nonpalpable left femoral pulse, nonpalpable pedal pulses.  After discussing the risk and benefits of left lower extremity angiogram in an effort to define and possibly improve perfusion to alleviate rest pain, Kristen Trujillo elected to proceed.  Findings:  Aortogram: Bilateral renal arteries.  Left side with accessory renal artery.  Infrarenal aorta patent with moderate disease.  No flow-limiting stenosis in the right iliac system.  Left common iliac internal iliac, external iliac arteries occluded with reconstitution of the common femoral artery.    On the right: Widely patent common femoral artery, profunda, superficial femoral artery.  The popliteal artery is patent.  There is three-vessel runoff to the level of the ankle, the foot was not visualized due to contrast volume limitations.  The left: Reconstitution of the common femoral artery.  There is significant disease appreciated.  The profunda and superficial femoral arteries are widely patent.  Popliteal artery widely patent.  The anterior tibial artery appears to occlude roughly 5 cm from its ostia.  The tibioperoneal trunk is patent with single-vessel peroneal  artery runoff to the level of the ankle.  The foot could not be evaluated due to contrast volume limitations.    Procedure:  The patient was identified in the holding area and taken to room 8.  The patient was then placed supine on the table and prepped and draped in the usual sterile fashion.  A time out was called.  Ultrasound was used to evaluate the right common femoral artery.  It was patent .  A digital ultrasound image was acquired.  A micropuncture needle was used to access the right common femoral artery under ultrasound guidance.  An 018 wire was advanced without resistance and a micropuncture sheath was placed.  The 018 wire was removed and a benson wire was placed.  The micropuncture sheath was exchanged for a 5 french sheath.  An omniflush catheter was advanced over the wire to the level of L-1.  An abdominal angiogram was obtained.  Next the Omni Flush catheter was moved to the terminal aorta and bilateral lower extremity runoff followed.   See results above.  Right-sided arteriotomy managed with Mynx without issue.  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  Disease in the common femoral artery.  Single-vessel peroneal artery runoff.  Kristen Trujillo would be best served with attempted recanalization of the left iliac system with left-sided common femoral endarterectomy.  I discussed the above with her.  Will work to schedule next week.   Cassandria Santee, MD [facility] [place] Office: [phone]

## 2022-09-15 NOTE — H&P (Addendum)
Patient seen and examined in preop holding.  No complaints. No changes to medication history or physical exam since last seen in clinic. In short, [name] is a 67 year old female with peripheral arterial disease.  Recent imaging demonstrated monophasic waveforms at the level of the ankle on the left foot with nonpalpable pulses.  She has previously had an ingrown toenail on the left foot, which healed, but continues to complain of rest pain.  She is a lifetime smoker, but recently quit due to being scared for limb loss.  Quitting smoking has improved her symptoms somewhat, but has not helped with wound healing. On physical exam, Nithya does not have a palpable pulse in the left groin, no sign of significant inflow disease.  My guess, is that she has multilevel occlusive disease.  With her longtime smoking history, I am concerned for Buerger's disease as well.  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  After discussing the risk and benefits of left lower extremity angiogram in an effort to define and improve distal perfusion for alleviation of rest pain, [name] elected to proceed.   Victorino Sparrow MD   [facility] [place]  Patient name: Kristen Trujillo MRN: [id number] DOB: 30-May-1956 Sex: female  REASON FOR CONSULT: Evaluation critical limb ischemia left lower extremity  HPI: Kristen Trujillo is a 67 y.o. female, who is here today for evaluation of critical limb ischemia.  She reports having an ingrown toenail and removal of this.  She has had pain since that time but was able to successfully heal this.  She has had persistent rest pain in her left foot.  This awakens her at night and she has found that holding her foot dependent does give her some temporary relief.  She has dependent rubor and pallor in her left foot.  He has no right foot symptoms.  She is a long-term cigarette smoker but quit 1 month ago.  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  Apparently there was no specific workup at that time.  She underwent amputation of the distal two thirds of her left finger.  She subsequently developed a contracture in her fourth and fifth fingers and underwent subsequent amputation of her fourth and fifth fingers as well.  She is right-handed.  He denies any cardiac history.  She does have history of emphysema related to her long-term cigarette smoking.  Past Medical History:  Diagnosis Date   Anxiety    Depression    Emphysema of lung (HCC)    Hypertension     Family History  Problem Relation Age of Onset   Hypertension Mother     SOCIAL HISTORY: Social History   Socioeconomic History   Marital status: Single    Spouse name: Not on file   Number of children: Not on file   Years of education: Not on file   Highest education level: Not on file  Occupational History   Not on file  Tobacco Use   Smoking status: Former    Packs/day: 1.00    Years: 50.00    Total pack years: 50.00    Types: Cigarettes    Quit date: 08/2022    Years since quitting: 0.1   Smokeless tobacco: Never  Vaping Use   Vaping Use: Never used  Substance and Sexual Activity   Alcohol use: No   Drug use: No   Sexual activity:  Not on file  Other Topics Concern   Not on file  Social History Narrative   ** Merged History Encounter **       Social Determinants of Health   Financial Resource Strain: Not on file  Food Insecurity: Not on file  Transportation Needs: Not on file  Physical Activity: Not on file  Stress: Not on file  Social Connections: Not on file  Intimate Partner Violence: Not on file    Allergies  Allergen Reactions   Penicillin G Nausea And Vomiting    Current Facility-Administered Medications  Medication Dose Route Frequency Provider Last Rate Last Admin   0.9 %  sodium chloride infusion   Intravenous Continuous Broadus John,  MD 100 mL/hr at 09/15/22 0729 New Bag at 09/15/22 0729    REVIEW OF SYSTEMS:  [X]  denotes positive finding, [ ]  denotes negative finding Cardiac  Comments:  Chest pain or chest pressure:    Shortness of breath upon exertion:    Short of breath when lying flat:    Irregular heart rhythm:        Vascular    Pain in calf, thigh, or hip brought on by ambulation: x   Pain in feet at night that wakes you up from your sleep:  x   Blood clot in your veins:    Leg swelling:         Pulmonary    Oxygen at home:    Productive cough:     Wheezing:  x       Neurologic    Sudden weakness in arms or legs:     Sudden numbness in arms or legs:     Sudden onset of difficulty speaking or slurred speech:    Temporary loss of vision in one eye:     Problems with dizziness:         Gastrointestinal    Blood in stool:     Vomited blood:         Genitourinary    Burning when urinating:     Blood in urine:        Psychiatric    Major depression:         Hematologic    Bleeding problems:    Problems with blood clotting too easily:        Skin    Rashes or ulcers:        Constitutional    Fever or chills:      PHYSICAL EXAM: Vitals:   09/15/22 0706  BP: (!) 185/89  Pulse: 85  Resp: 17  Temp: (!) 97.3 F (36.3 C)  TempSrc: Temporal  SpO2: 95%  Weight: 51.3 kg  Height: 5\' 8"  (1.727 m)    GENERAL: The patient is a well-nourished female, in no acute distress. The vital signs are documented above. CARDIOVASCULAR: 2+ radial pulses bilaterally.  2+ right femoral pulse, 2+ right popliteal pulse, 2+ dorsalis pedis pulse on the right.  On the left I do not palpate a femoral pulse or distal pulses. PULMONARY: There is good air exchange  MUSCULOSKELETAL: There are no major deformities or cyanosis.  Healed amputation of third fourth and fifth fingers on the left NEUROLOGIC: No focal weakness or paresthesias are detected. SKIN: There are no ulcers or rashes noted.  Dependent rubor in her  left foot PSYCHIATRIC: The patient has a normal affect.  DATA:  Noninvasive studies today reveal normal ankle arm index and normal triphasic waveforms in her right foot.  On  the left she has dampened monophasic flow with a ankle arm index of 0.40  MEDICAL ISSUES: Critical limb ischemia on the left.  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  She has a normal right femoral pulse and absent left femoral pulse.  I have recommended arteriography for further evaluation.  I explained that there is a high likelihood that this could be treated with endovascular means.  If this is too extensive, I did discuss the possibility of femoral to femoral bypass.  We will coordinate outpatient arteriography and possible endovascular treatment as soon as possible with one of my partners at [place], MD [facility] [phone] Pager [phone]  Note: Portions of this report may have been transcribed using voice recognition software.  Every effort has been made to ensure accuracy; however, inadvertent computerized transcription errors may still be present.

## 2022-09-15 NOTE — Progress Notes (Signed)
Patient and friend was given discharge instructions. Both verbalized understanding. 

## 2022-09-17 ENCOUNTER — Encounter (HOSPITAL_COMMUNITY): Payer: Self-pay | Admitting: Vascular Surgery

## 2022-09-17 ENCOUNTER — Other Ambulatory Visit: Payer: Self-pay

## 2022-09-17 NOTE — Progress Notes (Signed)
Spoke with pt for pre-op call. Pt denies cardiac history. Pt is treated for HTN and states she is not diabetic. Pt have Emphysema and does get short of breath with exertion.   Shower instructions given to pt and she voiced understanding.

## 2022-09-20 ENCOUNTER — Inpatient Hospital Stay (HOSPITAL_COMMUNITY): Payer: Medicare Other

## 2022-09-20 ENCOUNTER — Encounter (HOSPITAL_COMMUNITY): Payer: Self-pay | Admitting: Vascular Surgery

## 2022-09-20 ENCOUNTER — Inpatient Hospital Stay (HOSPITAL_COMMUNITY): Payer: Medicare Other | Admitting: Certified Registered"

## 2022-09-20 ENCOUNTER — Encounter (HOSPITAL_COMMUNITY): Admission: RE | Disposition: E | Payer: Self-pay | Source: Home / Self Care | Attending: Vascular Surgery

## 2022-09-20 ENCOUNTER — Inpatient Hospital Stay (HOSPITAL_COMMUNITY)
Admission: RE | Admit: 2022-09-20 | Discharge: 2022-10-07 | DRG: 252 | Disposition: E | Payer: Medicare Other | Attending: Vascular Surgery | Admitting: Vascular Surgery

## 2022-09-20 ENCOUNTER — Other Ambulatory Visit: Payer: Self-pay

## 2022-09-20 DIAGNOSIS — J439 Emphysema, unspecified: Secondary | ICD-10-CM | POA: Diagnosis present

## 2022-09-20 DIAGNOSIS — K683 Retroperitoneal hematoma: Secondary | ICD-10-CM | POA: Diagnosis not present

## 2022-09-20 DIAGNOSIS — Z87891 Personal history of nicotine dependence: Secondary | ICD-10-CM

## 2022-09-20 DIAGNOSIS — I998 Other disorder of circulatory system: Secondary | ICD-10-CM

## 2022-09-20 DIAGNOSIS — I772 Rupture of artery: Secondary | ICD-10-CM | POA: Diagnosis not present

## 2022-09-20 DIAGNOSIS — T182XXA Foreign body in stomach, initial encounter: Secondary | ICD-10-CM | POA: Diagnosis not present

## 2022-09-20 DIAGNOSIS — I9751 Accidental puncture and laceration of a circulatory system organ or structure during a circulatory system procedure: Secondary | ICD-10-CM | POA: Diagnosis not present

## 2022-09-20 DIAGNOSIS — I10 Essential (primary) hypertension: Secondary | ICD-10-CM | POA: Diagnosis present

## 2022-09-20 DIAGNOSIS — J449 Chronic obstructive pulmonary disease, unspecified: Secondary | ICD-10-CM

## 2022-09-20 DIAGNOSIS — Z880 Allergy status to penicillin: Secondary | ICD-10-CM | POA: Diagnosis not present

## 2022-09-20 DIAGNOSIS — Z89022 Acquired absence of left finger(s): Secondary | ICD-10-CM

## 2022-09-20 DIAGNOSIS — I70222 Atherosclerosis of native arteries of extremities with rest pain, left leg: Secondary | ICD-10-CM

## 2022-09-20 DIAGNOSIS — F418 Other specified anxiety disorders: Secondary | ICD-10-CM | POA: Diagnosis not present

## 2022-09-20 DIAGNOSIS — K6389 Other specified diseases of intestine: Secondary | ICD-10-CM | POA: Diagnosis not present

## 2022-09-20 DIAGNOSIS — Y838 Other surgical procedures as the cause of abnormal reaction of the patient, or of later complication, without mention of misadventure at the time of the procedure: Secondary | ICD-10-CM | POA: Diagnosis not present

## 2022-09-20 DIAGNOSIS — Z8249 Family history of ischemic heart disease and other diseases of the circulatory system: Secondary | ICD-10-CM | POA: Diagnosis not present

## 2022-09-20 HISTORY — PX: ULTRASOUND GUIDANCE FOR VASCULAR ACCESS: SHX6516

## 2022-09-20 HISTORY — PX: ENDARTERECTOMY FEMORAL: SHX5804

## 2022-09-20 HISTORY — DX: Pneumonia, unspecified organism: J18.9

## 2022-09-20 HISTORY — DX: Dyspnea, unspecified: R06.00

## 2022-09-20 HISTORY — DX: Peripheral vascular disease, unspecified: I73.9

## 2022-09-20 HISTORY — PX: INSERTION OF ILIAC STENT: SHX6256

## 2022-09-20 LAB — COMPREHENSIVE METABOLIC PANEL
ALT: 9 U/L (ref 0–44)
AST: 24 U/L (ref 15–41)
Albumin: 3.5 g/dL (ref 3.5–5.0)
Alkaline Phosphatase: 104 U/L (ref 38–126)
Anion gap: 9 (ref 5–15)
BUN: 8 mg/dL (ref 8–23)
CO2: 23 mmol/L (ref 22–32)
Calcium: 9.1 mg/dL (ref 8.9–10.3)
Chloride: 105 mmol/L (ref 98–111)
Creatinine, Ser: 0.93 mg/dL (ref 0.44–1.00)
GFR, Estimated: >60 mL/min (ref >60)
Glucose, Bld: 111 mg/dL — ABNORMAL HIGH (ref 70–99)
Potassium: 4.5 mmol/L (ref 3.5–5.1)
Sodium: 137 mmol/L (ref 135–145)
Total Bilirubin: 2.2 mg/dL — ABNORMAL HIGH (ref 0.3–1.2)
Total Protein: 6.3 g/dL — ABNORMAL LOW (ref 6.5–8.1)

## 2022-09-20 LAB — POCT I-STAT 7, (LYTES, BLD GAS, ICA,H+H)
Acid-base deficit: 10 mmol/L — ABNORMAL HIGH (ref 0.0–2.0)
Acid-base deficit: 20 mmol/L — ABNORMAL HIGH (ref 0.0–2.0)
Bicarbonate: 17.8 mmol/L — ABNORMAL LOW (ref 20.0–28.0)
Bicarbonate: 12.1 mmol/L — ABNORMAL LOW (ref 20.0–28.0)
Calcium, Ion: 1.15 mmol/L (ref 1.15–1.40)
Calcium, Ion: 0.91 mmol/L — ABNORMAL LOW (ref 1.15–1.40)
HCT: 29 % — ABNORMAL LOW (ref 36.0–46.0)
HCT: 30 % — ABNORMAL LOW (ref 36.0–46.0)
Hemoglobin: 9.9 g/dL — ABNORMAL LOW (ref 12.0–15.0)
Hemoglobin: 10.2 g/dL — ABNORMAL LOW (ref 12.0–15.0)
O2 Saturation: 100 %
O2 Saturation: 100 %
Potassium: 4.4 mmol/L (ref 3.5–5.1)
Potassium: 6.7 mmol/L (ref 3.5–5.1)
Sodium: 142 mmol/L (ref 135–145)
Sodium: 142 mmol/L (ref 135–145)
TCO2: 19 mmol/L — ABNORMAL LOW (ref 22–32)
TCO2: 14 mmol/L — ABNORMAL LOW (ref 22–32)
pCO2 arterial: 44.5 mmHg (ref 32–48)
pCO2 arterial: 57.2 mmHg — ABNORMAL HIGH (ref 32–48)
pH, Arterial: 7.21 — ABNORMAL LOW (ref 7.35–7.45)
pH, Arterial: 6.934 — CL (ref 7.35–7.45)
pO2, Arterial: 469 mmHg — ABNORMAL HIGH (ref 83–108)
pO2, Arterial: 534 mmHg — ABNORMAL HIGH (ref 83–108)

## 2022-09-20 LAB — CBC
HCT: 42.2 % (ref 36.0–46.0)
Hemoglobin: 14.6 g/dL (ref 12.0–15.0)
MCH: 30.6 pg (ref 26.0–34.0)
MCHC: 34.6 g/dL (ref 30.0–36.0)
MCV: 88.5 fL (ref 80.0–100.0)
Platelets: 290 10*3/uL (ref 150–400)
RBC: 4.77 MIL/uL (ref 3.87–5.11)
RDW: 14.8 % (ref 11.5–15.5)
WBC: 8.2 10*3/uL (ref 4.0–10.5)
nRBC: 0 % (ref 0.0–0.2)

## 2022-09-20 LAB — BPAM PLATELET PHERESIS
Blood Product Expiration Date: 202401182359
ISSUE DATE / TIME: 202401151322
Unit Type and Rh: 6200

## 2022-09-20 LAB — URINALYSIS, ROUTINE W REFLEX MICROSCOPIC
Bilirubin Urine: NEGATIVE
Glucose, UA: NEGATIVE mg/dL
Hgb urine dipstick: NEGATIVE
Ketones, ur: 20 mg/dL — AB
Leukocytes,Ua: NEGATIVE
Nitrite: NEGATIVE
Protein, ur: NEGATIVE mg/dL
Specific Gravity, Urine: 1.011 (ref 1.005–1.030)
pH: 7 (ref 5.0–8.0)

## 2022-09-20 LAB — ABO/RH: ABO/RH(D): B POS

## 2022-09-20 LAB — PREPARE PLATELET PHERESIS: Unit division: 0

## 2022-09-20 LAB — POCT ACTIVATED CLOTTING TIME
Activated Clotting Time: 228 seconds
Activated Clotting Time: 250 seconds

## 2022-09-20 LAB — PROTIME-INR
INR: 1.1 (ref 0.8–1.2)
Prothrombin Time: 13.7 seconds (ref 11.4–15.2)

## 2022-09-20 LAB — SURGICAL PCR SCREEN
MRSA, PCR: NEGATIVE
Staphylococcus aureus: NEGATIVE

## 2022-09-20 LAB — APTT: aPTT: 29 seconds (ref 24–36)

## 2022-09-20 SURGERY — ENDARTERECTOMY, FEMORAL
Anesthesia: Monitor Anesthesia Care | Site: Leg Upper | Laterality: Right

## 2022-09-20 MED ORDER — PHENYLEPHRINE HCL-NACL 20-0.9 MG/250ML-% IV SOLN
INTRAVENOUS | Status: DC | PRN
  Administered 2022-09-20: 40 ug/min via INTRAVENOUS

## 2022-09-20 MED ORDER — PROPOFOL 10 MG/ML IV BOLUS
INTRAVENOUS | Status: AC
  Filled 2022-09-20: qty 20

## 2022-09-20 MED ORDER — FENTANYL CITRATE (PF) 100 MCG/2ML IJ SOLN: 50.0000 ug | Freq: Once | INTRAMUSCULAR | Status: AC

## 2022-09-20 MED ORDER — EPINEPHRINE HCL 5 MG/250ML IV SOLN IN NS
INTRAVENOUS | Status: DC | PRN
  Administered 2022-09-20: 5 ug/min via INTRAVENOUS

## 2022-09-20 MED ORDER — DEXMEDETOMIDINE HCL IN NACL 80 MCG/20ML IV SOLN
INTRAVENOUS | Status: AC
  Filled 2022-09-20: qty 20

## 2022-09-20 MED ORDER — 0.9 % SODIUM CHLORIDE (POUR BTL) OPTIME
TOPICAL | Status: DC | PRN
  Administered 2022-09-20: 1000 mL

## 2022-09-20 MED ORDER — BUPIVACAINE-EPINEPHRINE (PF) 0.5% -1:200000 IJ SOLN
INTRAMUSCULAR | Status: DC | PRN
  Administered 2022-09-20: 3 mL

## 2022-09-20 MED ORDER — ASPIRIN 325 MG PO TBEC: 325.0000 mg | DELAYED_RELEASE_TABLET | Freq: Once | ORAL | Status: AC

## 2022-09-20 MED ORDER — PHENYLEPHRINE 80 MCG/ML (10ML) SYRINGE FOR IV PUSH (FOR BLOOD PRESSURE SUPPORT)
PREFILLED_SYRINGE | INTRAVENOUS | Status: DC | PRN
  Administered 2022-09-20: 160 ug via INTRAVENOUS

## 2022-09-20 MED ORDER — ASPIRIN 325 MG PO TBEC
DELAYED_RELEASE_TABLET | ORAL | Status: AC
  Administered 2022-09-20: 325 mg via ORAL
  Filled 2022-09-20: qty 1

## 2022-09-20 MED ORDER — CHLORHEXIDINE GLUCONATE 0.12 % MT SOLN
OROMUCOSAL | Status: AC
  Administered 2022-09-20: 15 mL via OROMUCOSAL
  Filled 2022-09-20: qty 15

## 2022-09-20 MED ORDER — LACTATED RINGERS IV SOLN: INTRAVENOUS | Status: DC

## 2022-09-20 MED ORDER — CALCIUM CHLORIDE 10 % IV SOLN
INTRAVENOUS | Status: DC | PRN
  Administered 2022-09-20: 1000 mg via INTRAVENOUS
  Administered 2022-09-20: 300 mg via INTRAVENOUS
  Administered 2022-09-20 (×3): 500 mg via INTRAVENOUS

## 2022-09-20 MED ORDER — VASOPRESSIN 20 UNIT/ML IV SOLN
INTRAVENOUS | Status: DC | PRN
  Administered 2022-09-20: 4 [IU] via INTRAVENOUS
  Administered 2022-09-20: 2 [IU] via INTRAVENOUS
  Administered 2022-09-20: 5 [IU] via INTRAVENOUS
  Administered 2022-09-20: 2 [IU] via INTRAVENOUS

## 2022-09-20 MED ORDER — CHLORHEXIDINE GLUCONATE CLOTH 2 % EX PADS: 6.0000 | MEDICATED_PAD | Freq: Once | CUTANEOUS | Status: DC

## 2022-09-20 MED ORDER — PROPOFOL 10 MG/ML IV BOLUS: INTRAVENOUS | Status: DC | PRN

## 2022-09-20 MED ORDER — DEXAMETHASONE SODIUM PHOSPHATE 10 MG/ML IJ SOLN
INTRAMUSCULAR | Status: AC
  Filled 2022-09-20: qty 1

## 2022-09-20 MED ORDER — IODIXANOL 320 MG/ML IV SOLN
INTRAVENOUS | Status: DC | PRN
  Administered 2022-09-20: 30 mL via INTRA_ARTERIAL

## 2022-09-20 MED ORDER — VANCOMYCIN HCL IN DEXTROSE 1-5 GM/200ML-% IV SOLN: 1000.0000 mg | INTRAVENOUS | Status: AC

## 2022-09-20 MED ORDER — VASOPRESSIN 20 UNIT/ML IV SOLN
INTRAVENOUS | Status: AC
  Filled 2022-09-20: qty 1

## 2022-09-20 MED ORDER — NOREPINEPHRINE 4 MG/250ML-% IV SOLN
INTRAVENOUS | Status: DC | PRN
  Administered 2022-09-20: 10 ug/min via INTRAVENOUS

## 2022-09-20 MED ORDER — DEXAMETHASONE SODIUM PHOSPHATE 10 MG/ML IJ SOLN
INTRAMUSCULAR | Status: DC | PRN
  Administered 2022-09-20: 5 mg via INTRAVENOUS

## 2022-09-20 MED ORDER — VANCOMYCIN HCL IN DEXTROSE 1-5 GM/200ML-% IV SOLN
INTRAVENOUS | Status: AC
  Administered 2022-09-20: 1000 mg via INTRAVENOUS
  Filled 2022-09-20: qty 200

## 2022-09-20 MED ORDER — MIDAZOLAM HCL 2 MG/2ML IJ SOLN
INTRAMUSCULAR | Status: AC
  Filled 2022-09-20: qty 2

## 2022-09-20 MED ORDER — CHLORHEXIDINE GLUCONATE 0.12 % MT SOLN: 15.0000 mL | Freq: Once | OROMUCOSAL | Status: AC

## 2022-09-20 MED ORDER — SODIUM CHLORIDE 0.9 % IV SOLN: INTRAVENOUS | Status: DC | PRN

## 2022-09-20 MED ORDER — HEPARIN SODIUM (PORCINE) 1000 UNIT/ML IJ SOLN
INTRAMUSCULAR | Status: AC
  Filled 2022-09-20: qty 20

## 2022-09-20 MED ORDER — SODIUM BICARBONATE 8.4 % IV SOLN
INTRAVENOUS | Status: DC | PRN
  Administered 2022-09-20 (×4): 50 meq via INTRAVENOUS

## 2022-09-20 MED ORDER — HEPARIN SODIUM (PORCINE) 1000 UNIT/ML IJ SOLN
INTRAMUSCULAR | Status: DC | PRN
  Administered 2022-09-20: 5000 [IU] via INTRAVENOUS
  Administered 2022-09-20: 2000 [IU] via INTRAVENOUS

## 2022-09-20 MED ORDER — FENTANYL CITRATE (PF) 100 MCG/2ML IJ SOLN
INTRAMUSCULAR | Status: AC
  Administered 2022-09-20: 50 ug via INTRAVENOUS
  Filled 2022-09-20: qty 2

## 2022-09-20 MED ORDER — SODIUM CHLORIDE 0.9 % IV SOLN: INTRAVENOUS | Status: DC

## 2022-09-20 MED ORDER — HEPARIN 6000 UNIT IRRIGATION SOLUTION
Status: AC
  Filled 2022-09-20: qty 500

## 2022-09-20 MED ORDER — GLYCOPYRROLATE 0.2 MG/ML IJ SOLN
INTRAMUSCULAR | Status: DC | PRN
  Administered 2022-09-20 (×2): .1 mg via INTRAVENOUS

## 2022-09-20 MED ORDER — ORAL CARE MOUTH RINSE: 15.0000 mL | Freq: Once | OROMUCOSAL | Status: AC

## 2022-09-20 MED ORDER — MIDAZOLAM HCL 5 MG/5ML IJ SOLN
INTRAMUSCULAR | Status: DC | PRN
  Administered 2022-09-20: 2 mg via INTRAVENOUS

## 2022-09-20 MED ORDER — EPINEPHRINE PF 1 MG/ML IJ SOLN
INTRAMUSCULAR | Status: DC | PRN
  Administered 2022-09-20 (×2): 1 mg via INTRAVENOUS
  Administered 2022-09-20 (×2): .5 mg via INTRAVENOUS
  Administered 2022-09-20: 1 mg via INTRAVENOUS
  Administered 2022-09-20 (×2): .5 mg via INTRAVENOUS
  Administered 2022-09-20 (×2): 1 mg via INTRAVENOUS

## 2022-09-20 MED ORDER — DEXMEDETOMIDINE HCL IN NACL 80 MCG/20ML IV SOLN
INTRAVENOUS | Status: DC | PRN
  Administered 2022-09-20 (×5): 4 ug via BUCCAL

## 2022-09-20 MED ORDER — PROPOFOL 10 MG/ML IV BOLUS
INTRAVENOUS | Status: DC | PRN
  Administered 2022-09-20: 50 ug/kg/min via INTRAVENOUS

## 2022-09-20 MED ORDER — HEPARIN 6000 UNIT IRRIGATION SOLUTION
Status: DC | PRN
  Administered 2022-09-20 (×2): 1

## 2022-09-20 MED ORDER — ALBUMIN HUMAN 5 % IV SOLN: INTRAVENOUS | Status: DC | PRN

## 2022-09-20 MED ORDER — LIDOCAINE 2% (20 MG/ML) 5 ML SYRINGE
INTRAMUSCULAR | Status: AC
  Filled 2022-09-20: qty 10

## 2022-09-20 SURGICAL SUPPLY — 89 items
BAG COUNTER SPONGE SURGICOUNT (BAG) ×2 IMPLANT
BALLN CODA OCL 2-9.0-35-120-3 (BALLOONS) ×2
BALLN MUSTANG 9X40X75 (BALLOONS) ×2
BALLOON COD OCL 2-9.0-35-120-3 (BALLOONS) IMPLANT
BALLOON MUSTANG 9X40X75 (BALLOONS) IMPLANT
CANISTER SUCT 3000ML PPV (MISCELLANEOUS) ×2 IMPLANT
CATH BEACON 5 .035 65 KMP TIP (CATHETERS) IMPLANT
CATH FLUSH .035X65 (CATHETERS) IMPLANT
CATH OMNI FLUSH .035X70CM (CATHETERS) IMPLANT
CATH OMNI FLUSH 5F 65CM (CATHETERS) ×2 IMPLANT
CATH QUICKCROSS SUPP .035X90CM (MICROCATHETER) IMPLANT
CHLORAPREP W/TINT 26 (MISCELLANEOUS) ×2 IMPLANT
CLIP LIGATING EXTRA MED SLVR (CLIP) ×2 IMPLANT
CLIP LIGATING EXTRA SM BLUE (MISCELLANEOUS) ×4 IMPLANT
CLOSURE PERCLOSE PROSTYLE (VASCULAR PRODUCTS) IMPLANT
COVER BACK TABLE 60X90IN (DRAPES) IMPLANT
DERMABOND ADVANCED .7 DNX12 (GAUZE/BANDAGES/DRESSINGS) ×2 IMPLANT
DRAIN CHANNEL 15F RND FF W/TCR (WOUND CARE) IMPLANT
DRAPE C-ARM 42X72 X-RAY (DRAPES) IMPLANT
DRAPE INCISE IOBAN 66X45 STRL (DRAPES) IMPLANT
DRSG COVADERM 4X14 (GAUZE/BANDAGES/DRESSINGS) IMPLANT
DRSG COVADERM 4X8 (GAUZE/BANDAGES/DRESSINGS) IMPLANT
ELECT REM PT RETURN 9FT ADLT (ELECTROSURGICAL) ×2
ELECT SOLID GEL RDN PRO-PADZ (MISCELLANEOUS) ×2
ELECTRODE REM PT RTRN 9FT ADLT (ELECTROSURGICAL) ×2 IMPLANT
ELECTRODE SOLI GEL RDN PROPADZ (MISCELLANEOUS) IMPLANT
EVACUATOR SILICONE 100CC (DRAIN) IMPLANT
GAUZE 4X4 16PLY ~~LOC~~+RFID DBL (SPONGE) IMPLANT
GLIDEWIRE ADV .035X260CM (WIRE) IMPLANT
GLOVE BIOGEL PI IND STRL 8 (GLOVE) ×2 IMPLANT
GOWN STRL REUS W/ TWL LRG LVL3 (GOWN DISPOSABLE) ×6 IMPLANT
GOWN STRL REUS W/TWL 2XL LVL3 (GOWN DISPOSABLE) ×4 IMPLANT
GOWN STRL REUS W/TWL LRG LVL3 (GOWN DISPOSABLE) ×6
GRAFT HEMASHIELD 8MM (Vascular Products) ×2 IMPLANT
GRAFT VASC STRG 30X8KNIT (Vascular Products) IMPLANT
INSERT FOGARTY 61MM (MISCELLANEOUS) IMPLANT
INSERT FOGARTY SM (MISCELLANEOUS) IMPLANT
KIT BASIN OR (CUSTOM PROCEDURE TRAY) ×2 IMPLANT
KIT ENCORE 26 ADVANTAGE (KITS) IMPLANT
KIT TURNOVER KIT B (KITS) ×2 IMPLANT
LOOP VESSEL MINI RED (MISCELLANEOUS) IMPLANT
NS IRRIG 1000ML POUR BTL (IV SOLUTION) ×4 IMPLANT
PACK ENDO MINOR (CUSTOM PROCEDURE TRAY) ×2 IMPLANT
PACK PERIPHERAL VASCULAR (CUSTOM PROCEDURE TRAY) ×2 IMPLANT
PAD ARMBOARD 7.5X6 YLW CONV (MISCELLANEOUS) ×4 IMPLANT
SET MICROPUNCTURE 5F STIFF (MISCELLANEOUS) IMPLANT
SHEATH BRITE TIP 7FRX11 (SHEATH) IMPLANT
SHEATH BRITE TIP 8F 45 (SHEATH) IMPLANT
SHEATH CATAPULT 7FR 45 (SHEATH) IMPLANT
SHEATH FAST CATH 10F 12CM (SHEATH) IMPLANT
SHEATH PINNACLE 5F 10CM (SHEATH) ×2 IMPLANT
SHEATH PINNACLE 8F 10CM (SHEATH) ×2 IMPLANT
SHEATH PINNACLE 9F 10CM (SHEATH) IMPLANT
SPONGE T-LAP 18X18 ~~LOC~~+RFID (SPONGE) IMPLANT
STAPLER VISISTAT 35W (STAPLE) IMPLANT
STENT VIABAHN 9X10X120 (Permanent Stent) IMPLANT
STENT VIABAHN 9X29X80 VBX (Permanent Stent) IMPLANT
STENT VIABAHN 9X5X120 (Permanent Stent) IMPLANT
STENT VIABAHN VBX 8X79X80 (Permanent Stent) IMPLANT
STENT VIABAHNBX 8X29X135 (Permanent Stent) IMPLANT
STENT VIABAHNBX 8X59X80 (Permanent Stent) IMPLANT
SUT MNCRL AB 4-0 PS2 18 (SUTURE) ×2 IMPLANT
SUT PROLENE 1 XLH 60 (SUTURE) IMPLANT
SUT PROLENE 4 0 RB 1 (SUTURE) ×20
SUT PROLENE 4-0 RB1 .5 CRCL 36 (SUTURE) IMPLANT
SUT PROLENE 4-0 RB1 18X2 ARM (SUTURE) IMPLANT
SUT PROLENE 5 0 C 1 24 (SUTURE) ×2 IMPLANT
SUT PROLENE 5 0 C 1 36 (SUTURE) IMPLANT
SUT PROLENE 6 0 BV (SUTURE) IMPLANT
SUT SILK 2 0 SH CR/8 (SUTURE) IMPLANT
SUT VIC AB 2-0 CT1 27 (SUTURE) ×2
SUT VIC AB 2-0 CT1 TAPERPNT 27 (SUTURE) ×2 IMPLANT
SUT VIC AB 3-0 SH 27 (SUTURE) ×2
SUT VIC AB 3-0 SH 27X BRD (SUTURE) ×2 IMPLANT
SYR 10ML LL (SYRINGE) IMPLANT
SYR 20CC LL (SYRINGE) IMPLANT
SYR 30ML LL (SYRINGE) IMPLANT
SYR MEDRAD MARK V 150ML (SYRINGE) ×2 IMPLANT
TOWEL GREEN STERILE (TOWEL DISPOSABLE) ×2 IMPLANT
TRAY FOLEY MTR SLVR 16FR STAT (SET/KITS/TRAYS/PACK) IMPLANT
TUBE CONNECTING 20X1/4 (TUBING) IMPLANT
TUBING HIGH PRESSURE 120CM (CONNECTOR) IMPLANT
UNDERPAD 30X36 HEAVY ABSORB (UNDERPADS AND DIAPERS) ×2 IMPLANT
WATER STERILE IRR 1000ML POUR (IV SOLUTION) ×2 IMPLANT
WIRE AMPLATZ SS-J .035X180CM (WIRE) IMPLANT
WIRE AMPLATZ SS-J .035X260CM (WIRE) IMPLANT
WIRE BENTSON .035X145CM (WIRE) ×2 IMPLANT
WIRE G V18X300 ST (WIRE) IMPLANT
YANKAUER SUCT BULB TIP NO VENT (SUCTIONS) IMPLANT

## 2022-09-21 ENCOUNTER — Encounter (HOSPITAL_COMMUNITY): Payer: Self-pay | Admitting: Vascular Surgery

## 2022-09-21 LAB — PREPARE FRESH FROZEN PLASMA
Unit division: 0
Unit division: 0
Unit division: 0
Unit division: 0
Unit division: 0
Unit division: 0
Unit division: 0
Unit division: 0

## 2022-09-21 LAB — BPAM RBC
Blood Product Expiration Date: 202401282359
Blood Product Expiration Date: 202401292359
Blood Product Expiration Date: 202401302359
Blood Product Expiration Date: 202401312359
Blood Product Expiration Date: 202402012359
Blood Product Expiration Date: 202402032359
Blood Product Expiration Date: 202402032359
Blood Product Expiration Date: 202402042359
Blood Product Expiration Date: 202402042359
Blood Product Expiration Date: 202402082359
Blood Product Expiration Date: 202402082359
Blood Product Expiration Date: 202402092359
Blood Product Expiration Date: 202402092359
Blood Product Expiration Date: 202402092359
Blood Product Expiration Date: 202402092359
Blood Product Expiration Date: 202402092359
Blood Product Expiration Date: 202402102359
Blood Product Expiration Date: 202402102359
Blood Product Expiration Date: 202402142359
Blood Product Expiration Date: 202402142359
ISSUE DATE / TIME: 202401151207
ISSUE DATE / TIME: 202401151207
ISSUE DATE / TIME: 202401151207
ISSUE DATE / TIME: 202401151207
ISSUE DATE / TIME: 202401151225
ISSUE DATE / TIME: 202401151225
ISSUE DATE / TIME: 202401151225
ISSUE DATE / TIME: 202401151225
ISSUE DATE / TIME: 202401151229
ISSUE DATE / TIME: 202401151229
ISSUE DATE / TIME: 202401151229
ISSUE DATE / TIME: 202401151229
ISSUE DATE / TIME: 202401151234
ISSUE DATE / TIME: 202401151234
ISSUE DATE / TIME: 202401151234
ISSUE DATE / TIME: 202401151234
ISSUE DATE / TIME: 202401151318
ISSUE DATE / TIME: 202401151318
ISSUE DATE / TIME: 202401151318
ISSUE DATE / TIME: 202401151318
Unit Type and Rh: 5100
Unit Type and Rh: 5100
Unit Type and Rh: 5100
Unit Type and Rh: 5100
Unit Type and Rh: 7300
Unit Type and Rh: 7300
Unit Type and Rh: 7300
Unit Type and Rh: 7300
Unit Type and Rh: 7300
Unit Type and Rh: 7300
Unit Type and Rh: 7300
Unit Type and Rh: 7300
Unit Type and Rh: 7300
Unit Type and Rh: 7300
Unit Type and Rh: 7300
Unit Type and Rh: 7300
Unit Type and Rh: 7300
Unit Type and Rh: 7300
Unit Type and Rh: 7300
Unit Type and Rh: 7300

## 2022-09-21 LAB — BPAM FFP
Blood Product Expiration Date: 202401182359
Blood Product Expiration Date: 202401182359
Blood Product Expiration Date: 202401182359
Blood Product Expiration Date: 202401182359
Blood Product Expiration Date: 202401202359
Blood Product Expiration Date: 202401202359
Blood Product Expiration Date: 202401202359
Blood Product Expiration Date: 202401202359
Blood Product Expiration Date: 202401202359
Blood Product Expiration Date: 202401202359
Blood Product Expiration Date: 202401202359
Blood Product Expiration Date: 202401202359
ISSUE DATE / TIME: 202401151235
ISSUE DATE / TIME: 202401151235
ISSUE DATE / TIME: 202401151235
ISSUE DATE / TIME: 202401151235
ISSUE DATE / TIME: 202401151302
ISSUE DATE / TIME: 202401151302
ISSUE DATE / TIME: 202401151302
ISSUE DATE / TIME: 202401151302
ISSUE DATE / TIME: 202401151323
ISSUE DATE / TIME: 202401151323
ISSUE DATE / TIME: 202401151323
ISSUE DATE / TIME: 202401151323
Unit Type and Rh: 6200
Unit Type and Rh: 6200
Unit Type and Rh: 6200
Unit Type and Rh: 6200
Unit Type and Rh: 7300
Unit Type and Rh: 7300
Unit Type and Rh: 7300
Unit Type and Rh: 7300
Unit Type and Rh: 7300
Unit Type and Rh: 7300
Unit Type and Rh: 7300
Unit Type and Rh: 7300

## 2022-09-21 LAB — TYPE AND SCREEN
ABO/RH(D): B POS
Antibody Screen: NEGATIVE
Unit division: 0
Unit division: 0
Unit division: 0
Unit division: 0
Unit division: 0
Unit division: 0
Unit division: 0
Unit division: 0
Unit division: 0
Unit division: 0
Unit division: 0
Unit division: 0
Unit division: 0
Unit division: 0
Unit division: 0
Unit division: 0
Unit division: 0
Unit division: 0
Unit division: 0
Unit division: 0

## 2022-09-21 LAB — BLOOD PRODUCT ORDER (VERBAL) VERIFICATION

## 2022-09-21 NOTE — Anesthesia Postprocedure Evaluation (Signed)
Anesthesia Post Note  Patient: Kristen Trujillo  Procedure(s) Performed: LEFT COMMON FEMORAL ENDARTERECTOMY (Left: Leg Upper) INSERTION OF ILIAC STENT (Left: Leg Upper) ULTRASOUND GUIDANCE FOR VASCULAR ACCESS (Right: Groin)     Comments: - patient with intraoperative code requiring aggressive resuscitation efforts but ultimately unsuccessful and surgeon pronounced intraoperative death.    No notable events documented.  Last Vitals:  Vitals:   Oct 16, 2022 0946 16-Oct-2022 0949  BP: (!) 184/81 (!) 173/83  Pulse: 79 77  Resp: (!) 22 14  Temp:    SpO2: 97% 98%    Last Pain:  Vitals:   10/16/2022 0949  PainSc: 0-No pain                 Belenda Cruise P Mickeal Daws

## 2022-09-29 MED FILL — Heparin Sodium (Porcine) Inj 1000 Unit/ML: INTRAMUSCULAR | Qty: 30 | Status: AC

## 2022-09-29 MED FILL — Sodium Chloride IV Soln 0.9%: INTRAVENOUS | Qty: 1000 | Status: AC

## 2022-10-07 NOTE — H&P (Signed)
Patient seen and examined in preop holding.  No complaints. No changes to medication history or physical exam since last seen. In short, Kristen Trujillo is a 67 year old female with peripheral arterial disease, critical limb ischemia in the left leg with rest pain.  She is lifetime smoker, but recently quit.  Recent angiogram demonstrated occluded left common and external iliac arteries with significant atherosclerotic disease in the left common femoral artery. After discussing the risks and benefits of left iliac stenting, iliofemoral endarterectomy, possible right common iliac artery stenting, Kristen Trujillo elected to proceed.   Victorino Sparrow MD     Patient seen and examined in preop holding.  No complaints. No changes to medication history or physical exam since last seen in clinic. In short, Kristen Trujillo is a 67 year old female with peripheral arterial disease.  Recent imaging demonstrated monophasic waveforms at the level of the ankle on the left foot with nonpalpable pulses.  She has previously had an ingrown toenail on the left foot, which healed, but continues to complain of rest pain.  She is a lifetime smoker, but recently quit due to being scared for limb loss.  Quitting smoking has improved her symptoms somewhat, but has not helped with wound healing. On physical exam, Kristen Trujillo does not have a palpable pulse in the left groin, no sign of significant inflow disease.  My guess, is that she has multilevel occlusive disease.  With her longtime smoking history, I am concerned for Buerger's disease as well.  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  After discussing the risk and benefits of left lower extremity angiogram in an effort to define and improve distal perfusion for alleviation of rest pain, Kristen Trujillo elected to proceed.   Victorino Sparrow MD   [facility] [place]  Patient name: Kristen Trujillo MRN:  [id number] DOB: 04/11/1956 Sex: female  REASON FOR CONSULT: Evaluation critical limb ischemia left lower extremity  HPI: Kristen Trujillo is a 67 y.o. female, who is here today for evaluation of critical limb ischemia.  She reports having an ingrown toenail and removal of this.  She has had pain since that time but was able to successfully heal this.  She has had persistent rest pain in her left foot.  This awakens her at night and she has found that holding her foot dependent does give her some temporary relief.  She has dependent rubor and pallor in her left foot.  He has no right foot symptoms.  She is a long-term cigarette smoker but quit 1 month ago.  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  Apparently there was no specific workup at that time.  She underwent amputation of the distal two thirds of her left finger.  She subsequently developed a contracture in her fourth and fifth fingers and underwent subsequent amputation of her fourth and fifth fingers as well.  She is right-handed.  He denies any cardiac history.  She does have history of emphysema related to her long-term cigarette smoking.  Past Medical History:  Diagnosis Date   Anxiety    Depression    Dyspnea    Emphysema of lung (HCC)    Hypertension    Peripheral vascular disease (HCC)    Pneumonia     Family History  Problem Relation Age of Onset   Hypertension Mother     SOCIAL HISTORY: Social History   Socioeconomic History  Marital status: Single    Spouse name: Not on file   Number of children: Not on file   Years of education: Not on file   Highest education level: Not on file  Occupational History   Not on file  Tobacco Use   Smoking status: Former    Packs/day: 1.00    Years: 50.00    Total pack years: 50.00    Types: Cigarettes    Quit date: 08/2022    Years since quitting: 0.1   Smokeless tobacco: Never  Vaping Use   Vaping Use: Never used   Substance and Sexual Activity   Alcohol use: No   Drug use: No   Sexual activity: Not on file  Other Topics Concern   Not on file  Social History Narrative   ** Merged History Encounter **       Social Determinants of Health   Financial Resource Strain: Not on file  Food Insecurity: Not on file  Transportation Needs: Not on file  Physical Activity: Not on file  Stress: Not on file  Social Connections: Not on file  Intimate Partner Violence: Not on file    Allergies  Allergen Reactions   Penicillin G Nausea And Vomiting    Current Facility-Administered Medications  Medication Dose Route Frequency Provider Last Rate Last Admin   0.9 %  sodium chloride infusion   Intravenous Continuous Broadus John, MD 10 mL/hr at 2022-10-19 0833 New Bag at October 19, 2022 7989   Chlorhexidine Gluconate Cloth 2 % PADS 6 each  6 each Topical Once Broadus John, MD       And   Chlorhexidine Gluconate Cloth 2 % PADS 6 each  6 each Topical Once Broadus John, MD       fentaNYL (SUBLIMAZE) 100 MCG/2ML injection            lactated ringers infusion   Intravenous Continuous Stoltzfus, March Rummage, DO 10 mL/hr at 19-Oct-2022 0832 New Bag at 2022-10-19 0832   midazolam (VERSED) 2 MG/2ML injection            vancomycin (VANCOCIN) IVPB 1000 mg/200 mL premix  1,000 mg Intravenous 60 min Pre-Op Broadus John, MD 200 mL/hr at Oct 19, 2022 0856 1,000 mg at Oct 19, 2022 0856    REVIEW OF SYSTEMS:  [X]  denotes positive finding, [ ]  denotes negative finding Cardiac  Comments:  Chest pain or chest pressure:    Shortness of breath upon exertion:    Short of breath when lying flat:    Irregular heart rhythm:        Vascular    Pain in calf, thigh, or hip brought on by ambulation: x   Pain in feet at night that wakes you up from your sleep:  x   Blood clot in your veins:    Leg swelling:         Pulmonary    Oxygen at home:    Productive cough:     Wheezing:  x       Neurologic    Sudden weakness in arms or  legs:     Sudden numbness in arms or legs:     Sudden onset of difficulty speaking or slurred speech:    Temporary loss of vision in one eye:     Problems with dizziness:         Gastrointestinal    Blood in stool:     Vomited blood:         Genitourinary  Burning when urinating:     Blood in urine:        Psychiatric    Major depression:         Hematologic    Bleeding problems:    Problems with blood clotting too easily:        Skin    Rashes or ulcers:        Constitutional    Fever or chills:      PHYSICAL EXAM: Vitals:   October 01, 2022 0751  BP: (!) 165/85  Pulse: 78  Resp: 18  Temp: (!) 97.3 F (36.3 C)  SpO2: 100%  Weight: 51.3 kg  Height: 5\' 8"  (1.727 m)    GENERAL: The patient is a well-nourished female, in no acute distress. The vital signs are documented above. CARDIOVASCULAR: 2+ radial pulses bilaterally.  2+ right femoral pulse, 2+ right popliteal pulse, 2+ dorsalis pedis pulse on the right.  On the left I do not palpate a femoral pulse or distal pulses. PULMONARY: There is good air exchange  MUSCULOSKELETAL: There are no major deformities or cyanosis.  Healed amputation of third fourth and fifth fingers on the left NEUROLOGIC: No focal weakness or paresthesias are detected. SKIN: There are no ulcers or rashes noted.  Dependent rubor in her left foot PSYCHIATRIC: The patient has a normal affect.  DATA:  Noninvasive studies today reveal normal ankle arm index and normal triphasic waveforms in her right foot.  On the left she has dampened monophasic flow with a ankle arm index of 0.40  MEDICAL ISSUES: Critical limb ischemia on the left.  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  She has a normal right femoral pulse and absent left femoral pulse.  I have recommended arteriography for further evaluation.  I explained that there is a high likelihood that this could be treated with endovascular means.  If this is too extensive, I did  discuss the possibility of femoral to femoral bypass.  We will coordinate outpatient arteriography and possible endovascular treatment as soon as possible with one of my partners at [place], MD [facility] [phone] Pager [phone]  Note: Portions of this report may have been transcribed using voice recognition software.  Every effort has been made to ensure accuracy; however, inadvertent computerized transcription errors may still be present.

## 2022-10-07 NOTE — Anesthesia Procedure Notes (Addendum)
Central Venous Catheter Insertion Performed by: Darral Dash, DO, anesthesiologist Start/End03-Feb-2024 12:08 PM, 10/09/22 12:20 PM Patient location: OR. Emergency situation Preanesthetic checklist: patient identified, IV checked and monitors and equipment checked Position: supine Patient sedated Hand hygiene performed  and maximum sterile barriers used  Catheter size: 9 Fr Central line was placed.MAC introducer Procedure performed using ultrasound guided technique. Ultrasound Notes:anatomy identified, needle tip was noted to be adjacent to the nerve/plexus identified, no ultrasound evidence of intravascular and/or intraneural injection and image(s) printed for medical record Attempts: 1 Following insertion, line sutured, dressing applied and Biopatch. Post procedure assessment: blood return through all ports, free fluid flow and no air  Patient tolerated the procedure well with no immediate complications.

## 2022-10-07 NOTE — Anesthesia Procedure Notes (Signed)
Spinal  Patient location during procedure: OR Start time: Oct 20, 2022 9:44 AM End time: 10/20/22 9:45 AM Staffing Performed: anesthesiologist  Anesthesiologist: Darral Dash, DO Performed by: Darral Dash, DO Authorized by: Darral Dash, DO   Preanesthetic Checklist Completed: patient identified, IV checked, site marked, risks and benefits discussed, surgical consent, monitors and equipment checked, pre-op evaluation and timeout performed Spinal Block Patient position: sitting Prep: DuraPrep Patient monitoring: heart rate, cardiac monitor, continuous pulse ox and blood pressure Approach: midline Location: L5-S1 Injection technique: single-shot Needle Needle type: Pencan  Needle gauge: 24 G Needle length: 10 cm Assessment Events: CSF return Additional Notes Patient identified. Risks/Benefits/Options discussed with patient including but not limited to bleeding, infection, nerve damage, paralysis, failed block, incomplete pain control, headache, blood pressure changes, nausea, vomiting, reactions to medications, itching and postpartum back pain. Confirmed with bedside nurse the patient's most recent platelet count. Confirmed with patient that they are not currently taking any anticoagulation, have any bleeding history or any family history of bleeding disorders. Patient expressed understanding and wished to proceed. All questions were answered. Sterile technique was used throughout the entire procedure. Please see nursing notes for vital signs. Warning signs of high block given to the patient including shortness of breath, tingling/numbness in hands, complete motor block, or any concerning symptoms with instructions to call for help. Patient was given instructions on fall risk and not to get out of bed. All questions and concerns addressed with instructions to call with any issues or inadequate analgesia.

## 2022-10-07 NOTE — Anesthesia Procedure Notes (Signed)
Arterial Line Insertion Start/End31-Jan-2024 9:00 AM, 10-06-22 9:15 AM Performed by: Lavell Luster, CRNA  Patient location: Pre-op. Preanesthetic checklist: patient identified, IV checked, site marked, risks and benefits discussed, surgical consent, monitors and equipment checked, pre-op evaluation, timeout performed and anesthesia consent Lidocaine 1% used for infiltration Left, radial was placed Catheter size: 20 G Hand hygiene performed  and maximum sterile barriers used   Attempts: 2 Procedure performed without using ultrasound guided technique. Following insertion, dressing applied and Biopatch. Post procedure assessment: normal and unchanged  Patient tolerated the procedure well with no immediate complications.

## 2022-10-07 NOTE — Anesthesia Procedure Notes (Signed)
Procedure Name: LMA Insertion Date/Time: October 06, 2022 11:12 AM  Performed by: Gwyndolyn Saxon, CRNAPre-anesthesia Checklist: Patient identified, Emergency Drugs available, Suction available and Patient being monitored Patient Re-evaluated:Patient Re-evaluated prior to induction Oxygen Delivery Method: Circle system utilized Preoxygenation: Pre-oxygenation with 100% oxygen Induction Type: IV induction LMA: LMA with gastric port inserted LMA Size: 4.0 Number of attempts: 1 Placement Confirmation: positive ETCO2 and breath sounds checked- equal and bilateral Dental Injury: Teeth and Oropharynx as per pre-operative assessment

## 2022-10-07 NOTE — Anesthesia Preprocedure Evaluation (Addendum)
Anesthesia Evaluation  Patient identified by MRN, date of birth, ID band Patient awake    Reviewed: Allergy & Precautions, NPO status , Patient's Chart, lab work & pertinent test results  Airway Mallampati: II  TM Distance: >3 FB Neck ROM: Full    Dental  (+) Edentulous Upper, Edentulous Lower   Pulmonary COPD,  COPD inhaler, former smoker   Pulmonary exam normal        Cardiovascular hypertension, Pt. on medications + Peripheral Vascular Disease   Rhythm:Regular Rate:Normal     Neuro/Psych   Anxiety Depression    negative neurological ROS     GI/Hepatic negative GI ROS, Neg liver ROS,,,  Endo/Other  negative endocrine ROS    Renal/GU negative Renal ROS  negative genitourinary   Musculoskeletal negative musculoskeletal ROS (+)    Abdominal Normal abdominal exam  (+)   Peds  Hematology negative hematology ROS (+)   Anesthesia Other Findings   Reproductive/Obstetrics                             Anesthesia Physical Anesthesia Plan  ASA: 3  Anesthesia Plan: MAC and Spinal   Post-op Pain Management:    Induction: Intravenous  PONV Risk Score and Plan: 2 and Ondansetron, Dexamethasone, Midazolam and Treatment may vary due to age or medical condition  Airway Management Planned: Simple Face Mask, Natural Airway and Nasal Cannula  Additional Equipment: Arterial line  Intra-op Plan:   Post-operative Plan:   Informed Consent: I have reviewed the patients History and Physical, chart, labs and discussed the procedure including the risks, benefits and alternatives for the proposed anesthesia with the patient or authorized representative who has indicated his/her understanding and acceptance.     Dental advisory given  Plan Discussed with: CRNA  Anesthesia Plan Comments: (Lab Results      Component                Value               Date                      WBC                       8.2                 2022/10/11                HGB                      14.6                Oct 11, 2022                HCT                      42.2                Oct 11, 2022                MCV                      88.5                Oct 11, 2022                PLT  290                 25-Sep-2022             Lab Results      Component                Value               Date                      NA                       139                 09/15/2022                K                        3.8                 09/15/2022                CO2                      30                  02/06/2009                GLUCOSE                  144 (H)             09/15/2022                BUN                      7 (L)               09/15/2022                CREATININE               1.10 (H)            09/15/2022                CALCIUM                  9.7                 02/06/2009                GFRNONAA                 44 (L)              02/06/2009           )       Anesthesia Quick Evaluation

## 2022-10-07 NOTE — Discharge Summary (Signed)
Pt with intraoperative death from left iliac stenting which perforated the artery leading to retroperitoneal hemorrhage. Please see operative note for further details.    Broadus John MD

## 2022-10-07 NOTE — Op Note (Signed)
NAME: Kristen Trujillo    MRN: [id number] DOB: Jan 07, 1956    DATE OF OPERATION: 10-18-2022  PREOP DIAGNOSIS:    Left lower extremity critical limb ischemia with rest pain  POSTOP DIAGNOSIS:    Same  PROCEDURE:    Left common femoral artery cutdown Ultrasound-guided micropuncture access of the right common femoral artery Bilateral common iliac artery stenting 8 x 29 right, 8 x 79 left Left external iliac artery stenting 8 x 59  Retroperitoneal exposure of the left external iliac artery Left common iliac vein repair  SURGEON: Broadus John  ASSIST: Kristen Curling, MD  ANESTHESIA: General  EBL: 8 L  INDICATIONS:    Kristen Trujillo is a 67 y.o. female who was initially seen with left-sided critical limb ischemia with rest pain.  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  After discussing risk and benefits of left femoral artery cutdown, left-sided iliac artery stenting, with possible kissing stents, Kristen Trujillo elected to proceed.  FINDINGS:   Intraoperative mortality-time of death 107 Left external iliac artery rupture  TECHNIQUE:   Patient is brought to the OR laid in supine position.  Initially spinal anesthesia was induced and due to the patient's history of emphysema, and Kristen Trujillo was prepped and draped in standard fashion.  Case began with ultrasound insonation of the left common femoral artery.  A longitudinal incision was made.  The left common femoral artery was exposed from the distal external iliac artery to the superficial femoral artery and profunda in standard fashion.  Vesseloops were used to control these arteries as well as the branches.  She was having coughing spells, therefore the decision was made to general anesthesia.  Next, I used an ultrasound-guided micropuncture needle to access the right common femoral artery.  A wire was run into the infrarenal aorta.   The sheath was upsized to 6 [place], and Omni Flush catheter and Glidewire advantage was used to cross the left-sided common iliac artery, external iliac artery occlusions.  Angiography proved true lumen distally.  Next, the patient was heparinized to an ACT greater than 250 for the remainder of the case.  The left groin was controlled and opened and the wire externalized.  Next, a quick cross catheter was run over the externalized wire into the infrarenal abdominal aorta.  This allowed for bilateral wire access to the aorta.  At this point, an 8 [place] sheath was placed in the left groin and a 7 French sheath was placed in the right groin.  I elected to build down, and therefore an 8 x 29 mm Gore VBX stent was placed in the left common iliac artery, 8 x 79 mm Gore VBX stent in the right common iliac artery and kissed in the terminal aorta.  Following this, I moved to continue stenting down the left common and external iliac arteries the entirety of the artery was occluded.  Another 8 x 59 mm VBX stent was brought onto the field and inflated.  As the balloon was deflated, the patient's pressure dropped follow-up angiography demonstrated rupture at the distal aspect of the recently placed stent.  The balloon was reinflated for control.  There was significant EBL during this time.  The case was paused in an effort to allow anesthesia to catch up.  The right-sided access was upsized in an effort to deploy an aortic occlusion balloon.  I was having trouble passing the aortic occlusion balloon,  therefore when the patient was resuscitated, I elected to remove the iliac occlusion balloon to exchange for a 9 mm x 10 cm Gore Viabahn stent ready to deploy along the left-sided wire.  Upon removal of the balloon, the shaft broke in half which meant that I could not inflate nor could I remove the balloon off of the wire.  I had no proximal control. I immediately grabbed a 10 blade and made a retroperitoneal incision the medial  lateral to the left rectus.  I used my hand as initial control of the common iliac artery.  Once controlled, again anesthesia worked to resuscitate.  I called my partner Dr. Fortunato Trujillo to help with the case.  During this resuscitation, Kristen Trujillo coded, but achieved ROSC.  Unfortunately, during further dissection of the proximal external iliac artery, Kristen Trujillo coded again, requiring several rounds of chest compressions.  The Weitlander retractor that was allowing [place] exposure for planned external iliac artery to common femoral artery bypass, caused three punctate common iliac vein injuries during chest compressions.  These were controlled with Prolene suture.  ROSC was once again achieved, but was short-lived.  In all, the patient was coded for over 20 minutes and received multiple rounds of chest compressions.  Chest compression, defibrillation, blood product and significant vasopressor were all utilized for lifesaving measures.  Unfortunately, we were unable to achieve return of spontaneous circulation.  Time of death 13:25  Kristen Burows, MD [facility] DATE OF DICTATION:   09-28-2022

## 2022-10-07 NOTE — Progress Notes (Signed)
   2022/10/17 1500  Spiritual Encounters  Type of Visit Initial  Care provided to: [facility] partners present during encounter Nurse  Referral source Nurse (RN/NT/LPN)  Reason for visit Patient death  OnCall Visit No  Interventions  Spiritual Care Interventions Made Compassionate presence;Reflective listening   Received call from nurse to provide care for family in OR. Met with daughter of deceased with grand child. [facility] asked how she could help? Daughter was in shock because she was not expect her mother to die. Provided reflective listening and room to process the loss. Provided compassionate support and shared patient placement card to call when ready to release body.   Gean Birchwood, Resident Chaplain [phone]

## 2022-10-07 NOTE — Transfer of Care (Signed)
Immediate Anesthesia Transfer of Care Note  Patient: Kristen Trujillo  Procedure(s) Performed: LEFT COMMON FEMORAL ENDARTERECTOMY (Left: Leg Upper) INSERTION OF ILIAC STENT (Left: Leg Upper) ULTRASOUND GUIDANCE FOR VASCULAR ACCESS (Right: Groin)  Patient Location: PACU and viewing for family  Anesthesia Type:General and Spinal  Level of Consciousness: unresponsive and Patient remains intubated per anesthesia plan  Airway & Oxygen Therapy:  Time of death 83  Post-op Assessment:  Time of death 24  Post vital signs: unstable  Last Vitals:  Vitals Value Taken Time  BP    Temp    Pulse    Resp    SpO2      Last Pain:  Vitals:   23-Sep-2022 0949  PainSc: 0-No pain         Complications: No notable events documented.

## 2022-10-07 NOTE — Anesthesia Procedure Notes (Signed)
Procedure Name: Intubation Date/Time: 2022/10/01 12:04 PM  Performed by: Darral Dash, DOPre-anesthesia Checklist: Patient identified, Emergency Drugs available, Suction available and Patient being monitored Patient Re-evaluated:Patient Re-evaluated prior to induction Oxygen Delivery Method: Circle system utilized Laryngoscope Size: Sabra Heck and 2 Grade View: Grade I Tube type: Oral Tube size: 7.0 mm Number of attempts: 1 Airway Equipment and Method: Stylet Placement Confirmation: ETT inserted through vocal cords under direct vision, positive ETCO2 and breath sounds checked- equal and bilateral Secured at: 20 cm Tube secured with: Tape Dental Injury: Teeth and Oropharynx as per pre-operative assessment  Comments: ETT placed emergently during code. Grade I view.

## 2022-10-07 DEATH — deceased
